# Patient Record
Sex: Male | Born: 1995 | Race: Black or African American | Hispanic: No | Marital: Single | Smoking: Current every day smoker
Health system: Southern US, Community
[De-identification: ages and names within clinical notes are randomized; demographics above are authoritative.]

---
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
## 2016-03-03 ENCOUNTER — Encounter (HOSPITAL_COMMUNITY): Payer: Self-pay | Admitting: Emergency Medicine

## 2016-03-03 ENCOUNTER — Emergency Department (HOSPITAL_COMMUNITY)
Admission: EM | Admit: 2016-03-03 | Discharge: 2016-03-03 | Disposition: A | Discharge status: Home / Self Care | Payer: Medicaid Other | Attending: Emergency Medicine | Admitting: Emergency Medicine

## 2016-03-03 DIAGNOSIS — R300 Dysuria: Secondary | ICD-10-CM

## 2016-03-03 DIAGNOSIS — R361 Hematospermia: Secondary | ICD-10-CM | POA: Insufficient documentation

## 2016-03-03 DIAGNOSIS — Z79899 Other long term (current) drug therapy: Secondary | ICD-10-CM | POA: Insufficient documentation

## 2016-03-03 MED ORDER — CEFTRIAXONE SODIUM 250 MG IJ SOLR
250.0000 mg | Freq: Once | INTRAMUSCULAR | Status: AC
  Administered 2016-03-03: 250 mg via INTRAMUSCULAR
  Filled 2016-03-03: qty 250

## 2016-03-03 MED ORDER — ONDANSETRON 4 MG PO TBDP
4.0000 mg | ORAL_TABLET | Freq: Once | ORAL | Status: AC
  Administered 2016-03-03: 4 mg via ORAL
  Filled 2016-03-03: qty 1

## 2016-03-03 MED ORDER — STERILE WATER FOR INJECTION IJ SOLN
INTRAMUSCULAR | Status: AC
  Administered 2016-03-03: 10 mL
  Filled 2016-03-03: qty 10

## 2016-03-03 MED ORDER — AZITHROMYCIN 250 MG PO TABS
1000.0000 mg | ORAL_TABLET | Freq: Once | ORAL | Status: AC
  Administered 2016-03-03: 1000 mg via ORAL
  Filled 2016-03-03: qty 4

## 2016-03-03 NOTE — Discharge Instructions (Signed)
Read the information below.  You may return to the Emergency Department at any time for worsening condition or any new symptoms that concern you.   If you develop high fevers, testicular or abdominal pain, uncontrolled vomiting, or are unable to tolerate fluids by mouth, return to the ER for a recheck.

## 2016-03-03 NOTE — ED Provider Notes (Signed)
[facility]-EMERGENCY DEPT Provider Note   CSN: [id number] Arrival date & time: 03/03/16  1626     History   Chief Complaint Chief Complaint  Patient presents with  . Dysuria    HPI Allen Griffith is a 21 y.o. male.  HPI   Patient presents with episodes of tingling in his penis following urination and ejaculation x 1 week following unprotected sex.  This was not a new partner.  Has known hydrocele on the right and occasionally has pain in the right testicle after multiple episodes of ejaculation.  Last night he noticed blood in his semen and became concerned.  [facility] and had UA and STD testing two days ago, is still waiting for results.  Denies fevers, chills, myalgias, N/V, abdominal pain, current penile or testicular pain, testicular swelling.       History reviewed. No pertinent past medical history.  There are no active problems to display for this patient.   History reviewed. No pertinent surgical history.     Home Medications    Prior to Admission medications   Medication Sig Start Date End Date Taking? Authorizing Provider  doxycycline (VIBRAMYCIN) 100 MG capsule Take 1 capsule (100 mg total) by mouth 2 (two) times daily. Patient not taking: Reported on 09/23/2015 05/13/15   Tatyana Kirichenko, PA-C  ibuprofen (ADVIL,MOTRIN) 200 MG tablet Take 400 mg by mouth every 6 (six) hours as needed for headache, mild pain or moderate pain.     Historical Provider, MD  morphine (MSIR) 15 MG tablet Take 1 tablet (15 mg total) by mouth every 4 (four) hours as needed for severe pain. 09/23/15   Melene Planan [place], DO    Family History History reviewed. No pertinent family history.  Social History Social History  Substance Use Topics  . Smoking status: Never Smoker  . Smokeless tobacco: Never Used  . Alcohol use No     Allergies   Patient has no known allergies.   Review of Systems Review of Systems  All other systems reviewed and are  negative.    Physical Exam Updated Vital Signs BP 127/99 (BP Location: Right Arm)   Pulse 79   Temp 98.2 F (36.8 C) (Oral)   Resp 18   Ht 6' (1.829 m)   Wt 99.8 kg   SpO2 98%   BMI 29.84 kg/m   Physical Exam  Constitutional: He appears well-developed and well-nourished. No distress.  HENT:  Head: Normocephalic and atraumatic.  Neck: Neck supple.  Cardiovascular: Normal rate and regular rhythm.   Pulmonary/Chest: Effort normal and breath sounds normal. No respiratory distress. He has no wheezes. He has no rales.  Abdominal: Soft. He exhibits no distension and no mass. There is no tenderness. There is no rebound and no guarding.  Neurological: He is alert. He exhibits normal muscle tone.  Skin: He is not diaphoretic.  Nursing note and vitals reviewed.    ED Treatments / Results  Labs (all labs ordered are listed, but only abnormal results are displayed) Labs Reviewed - No data to display  EKG  EKG Interpretation None       Radiology No results found.  Procedures Procedures (including critical care time)  Medications Ordered in ED Medications  cefTRIAXone (ROCEPHIN) injection 250 mg (250 mg Intramuscular Given 03/03/16 1942)  azithromycin (ZITHROMAX) tablet 1,000 mg (1,000 mg Oral Given 03/03/16 1940)  ondansetron (ZOFRAN-ODT) disintegrating tablet 4 mg (4 mg Oral Given 03/03/16 1940)  sterile water (preservative free) injection (10 mLs  Given 03/03/16 1942)     Initial Impression / Assessment and Plan / ED Course  I have reviewed the triage vital signs and the nursing notes.  Pertinent labs & imaging results that were available during my care of the patient were reviewed by me and considered in my medical decision making (see chart for details).  Clinical Course     Afebrile, nontoxic patient with known right hydrocele and new dysuria and penile pain after ejaculation.  [facility] these [facility] two days ago and tested for STDs/HIV,  urine also tested for infection.  He is awaiting results.  Came to ED today after seeing blood in his semen and becoming concerned.  Pt denies any penile or genital lesions or current pain.  Discussed options and engaged in joint medical decision making with the patient.  I do not think it would be helpful to rerun the same tests performed by the health department and patient agrees.  He is not having current testicular pain or swelling, do not believe ultrasound would be helpful at this time.  I did offer him empiric treatment for GC/Chlam and he agreed with this.  I have also encouraged him to follow up with urology.  He has been referred before for his hydrocele but never followed up.   D/C home with urology follow up.  Discussed result, findings, treatment, and follow up  with patient.  Pt given return precautions.  Pt verbalizes understanding and agrees with plan.       Final Clinical Impressions(s) / ED Diagnoses   Final diagnoses:  Hematospermia  Dysuria    New Prescriptions Discharge Medication List as of 03/03/2016  7:27 PM       Trixie Dredge, PA-C 03/03/16 2009    Nelva Nay, MD 03/04/16 1357

## 2016-03-03 NOTE — ED Triage Notes (Addendum)
Patient reports pain with urination x5 days. Denies abdominal pain, penile discharge, flank pain, N/V/D.

## 2016-07-19 ENCOUNTER — Encounter (HOSPITAL_COMMUNITY): Payer: Self-pay

## 2016-07-19 ENCOUNTER — Emergency Department (HOSPITAL_COMMUNITY)
Admission: EM | Admit: 2016-07-19 | Discharge: 2016-07-19 | Disposition: A | Discharge status: Home / Self Care | Payer: Self-pay | Attending: Emergency Medicine | Admitting: Emergency Medicine

## 2016-07-19 DIAGNOSIS — F17210 Nicotine dependence, cigarettes, uncomplicated: Secondary | ICD-10-CM | POA: Insufficient documentation

## 2016-07-19 DIAGNOSIS — R369 Urethral discharge, unspecified: Secondary | ICD-10-CM | POA: Insufficient documentation

## 2016-07-19 DIAGNOSIS — Z202 Contact with and (suspected) exposure to infections with a predominantly sexual mode of transmission: Secondary | ICD-10-CM | POA: Insufficient documentation

## 2016-07-19 DIAGNOSIS — Z79899 Other long term (current) drug therapy: Secondary | ICD-10-CM | POA: Insufficient documentation

## 2016-07-19 DIAGNOSIS — Z711 Person with feared health complaint in whom no diagnosis is made: Secondary | ICD-10-CM

## 2016-07-19 LAB — URINALYSIS, ROUTINE W REFLEX MICROSCOPIC
Bacteria, UA: NONE SEEN
Bilirubin Urine: NEGATIVE
Glucose, UA: NEGATIVE mg/dL
Hgb urine dipstick: NEGATIVE
KETONES UR: 5 mg/dL — AB
Nitrite: NEGATIVE
PH: 7 (ref 5.0–8.0)
PROTEIN: NEGATIVE mg/dL
SPECIFIC GRAVITY, URINE: 1.027 (ref 1.005–1.030)

## 2016-07-19 MED ORDER — CEFTRIAXONE SODIUM 250 MG IJ SOLR
250.0000 mg | Freq: Once | INTRAMUSCULAR | Status: AC
  Administered 2016-07-19: 250 mg via INTRAMUSCULAR
  Filled 2016-07-19: qty 250

## 2016-07-19 MED ORDER — METRONIDAZOLE 500 MG PO TABS
2000.0000 mg | ORAL_TABLET | Freq: Once | ORAL | Status: AC
  Administered 2016-07-19: 2000 mg via ORAL
  Filled 2016-07-19: qty 4

## 2016-07-19 MED ORDER — AZITHROMYCIN 250 MG PO TABS
1000.0000 mg | ORAL_TABLET | Freq: Every day | ORAL | Status: DC
  Administered 2016-07-19: 1000 mg via ORAL
  Filled 2016-07-19: qty 4

## 2016-07-19 MED ORDER — STERILE WATER FOR INJECTION IJ SOLN
INTRAMUSCULAR | Status: AC
  Administered 2016-07-19: 0.9 mL
  Filled 2016-07-19: qty 10

## 2016-07-19 NOTE — ED Triage Notes (Signed)
Patient c/o penile drainage, yellow in color x 3-4 days.

## 2016-07-19 NOTE — Discharge Instructions (Signed)
Your gonorrhea and chlamydia testing is not yet resulted.   We will notify you if your results are positive within 48-72 hours.  Please notify all partners of your symptoms so they can seek testing and treatment.

## 2016-07-19 NOTE — ED Provider Notes (Signed)
[facility]-EMERGENCY DEPT Provider Note   CSN: [id number] Arrival date & time: 07/19/16  1747  By signing my name below, I, Cynda AcresHailei Fulton, attest that this documentation has been prepared under the direction and in the presence of Sharen Hecklaudia, [place], [place]. Electronically Signed: Cynda AcresHailei Fulton, Scribe. 07/19/16. 8:37 PM.  History   Chief Complaint Chief Complaint  Patient presents with  . std check  . penile drainage    HPI Comments: Allen Griffith is a 21 y.o. male with no pertinent past medical history, who presents to the Emergency Department complaining of sudden-onset, intermittent yellow penile discharge that began 2 days ago. Patient reports having unprotected sex with a male recently. Patient states he usually wears a condom, but this time did not. Patient denies any testicular pain, dysuria, or hematuria. Patient states he was tested for STD's two months ago at [facility]. Patient reports associated abdominal pain after urinating and lower back pain. No modifying factors indicated. Patient denies any penile bleeding, penile pain, fever, chills, nausea, or vomiting.   The history is provided by the patient. No language interpreter was used.    History reviewed. No pertinent past medical history.  There are no active problems to display for this patient.   History reviewed. No pertinent surgical history.     Home Medications    Prior to Admission medications   Medication Sig Start Date End Date Taking? Authorizing Provider  doxycycline (VIBRAMYCIN) 100 MG capsule Take 1 capsule (100 mg total) by mouth 2 (two) times daily. Patient not taking: Reported on 09/23/2015 05/13/15   Jaynie CrumbleKirichenko, Tatyana, PA-C  ibuprofen (ADVIL,MOTRIN) 200 MG tablet Take 400 mg by mouth every 6 (six) hours as needed for headache, mild pain or moderate pain.     [provider]  morphine (MSIR) 15 MG tablet Take 1 tablet (15 mg total) by mouth every 4 (four) hours as needed for severe  pain. 09/23/15   Melene PlanFloyd, Dan, DO    Family History History reviewed. No pertinent family history.  Social History Social History  Substance Use Topics  . Smoking status: Current Every Day Smoker    Packs/day: 0.50    Types: Cigarettes  . Smokeless tobacco: Never Used  . Alcohol use No     Allergies   Patient has no known allergies.   Review of Systems Review of Systems  Constitutional: Negative for chills and fever.  HENT: Negative for congestion and sore throat.   Eyes: Negative for visual disturbance.  Respiratory: Negative for cough, chest tightness and shortness of breath.   Cardiovascular: Negative for chest pain.  Gastrointestinal: Negative for abdominal pain, constipation, diarrhea, nausea and vomiting.  Genitourinary: Positive for discharge. Negative for decreased urine volume, difficulty urinating, dysuria, hematuria, penile pain and testicular pain.  Musculoskeletal: Negative for arthralgias, back pain and joint swelling.  Skin: Negative for rash.  Neurological: Negative for dizziness, light-headedness and headaches.     Physical Exam Updated Vital Signs BP 124/65 (BP Location: Right Arm)   Pulse 63   Temp 98.6 F (37 C) (Oral)   Resp 18   Ht 6' (1.829 m)   Wt 88.9 kg (196 lb)   SpO2 99%   BMI 26.58 kg/m   Physical Exam  Constitutional: He is oriented to person, place, and time. He appears well-developed and well-nourished. No distress.  NAD.  HENT:  Head: Normocephalic and atraumatic.  Right Ear: External ear normal.  Left Ear: External ear normal.  Nose: Nose normal.  Moist mucous membranes.  No intraoral lesions Normal oropharynx and TMs  Eyes: Conjunctivae and EOM are normal. No scleral icterus.  Neck: Normal range of motion. Neck supple.  Cardiovascular: Normal rate, regular rhythm, normal heart sounds and intact distal pulses.   No murmur heard. Pulmonary/Chest: Effort normal and breath sounds normal. He has no wheezes.  Abdominal:  No  CVAT bilaterally.  No suprapubic tenderness. Negative Murphy's. Negative McBurney's. Negative Psoas sign.   Genitourinary: No penile tenderness.  Genitourinary Comments: Chaperone (scribe) was present for exam which was performed with no discomfort or complications.  External genitalia normal without erythema, edema, tenderness or lesions.  Circumcised male.  No groin lymphadenopathy.  No meatus discharge.  Glans and shaft smooth without tenderness, lesions, masses or deformity.  Scrotum without lesions or edema.  Non tender testicles. Epididymis and spermatic cord without tenderness or masses, bilaterally. Cremasteric reflex intact.  Musculoskeletal: Normal range of motion. He exhibits no deformity.  Neurological: He is alert and oriented to person, place, and time.  Skin: Skin is warm and dry. Capillary refill takes less than 2 seconds.  Psychiatric: He has a normal mood and affect. His behavior is normal. Judgment and thought content normal.  Nursing note and vitals reviewed.    ED Treatments / Results  DIAGNOSTIC STUDIES: Oxygen Saturation is 99% on RA, normal by my interpretation.    COORDINATION OF CARE: 8:36 PM Discussed treatment plan with pt at bedside and pt agreed to plan, which includes an STD check and antibiotics.   Labs (all labs ordered are listed, but only abnormal results are displayed) Labs Reviewed  URINALYSIS, ROUTINE W REFLEX MICROSCOPIC  GC/CHLAMYDIA PROBE AMP ([place]) NOT AT [facility]    EKG  EKG Interpretation None       Radiology No results found.  Procedures Procedures (including critical care time)  Medications Ordered in ED Medications  azithromycin (ZITHROMAX) tablet 1,000 mg (1,000 mg Oral Given 07/19/16 2105)  cefTRIAXone (ROCEPHIN) injection 250 mg (250 mg Intramuscular Given 07/19/16 2106)  metroNIDAZOLE (FLAGYL) tablet 2,000 mg (2,000 mg Oral Given 07/19/16 2105)  sterile water (preservative free) injection (0.9 mLs  Given 07/19/16  2107)     Initial Impression / Assessment and Plan / ED Course  I have reviewed the triage vital signs and the nursing notes.  Pertinent labs & imaging results that were available during my care of the patient were reviewed by me and considered in my medical decision making (see chart for details).    Pt is a 21 y.o. male with pmh of who presents with penile discharge after unprotected sexual interourse. Pt genital or oral lesions, fevers.  Pt otherwise asymptomatic.  On exam, pt in NAD. Nontoxic/nonseptic appearing. VSS and within normal limits. Afebrile. Abdomen nontender soft. GU exam unremarkable. GC/C pending. U/A negative. Given empiric tx for GC/C/trich in ED. Patient will be discharged at this time. ED return precautions given. STD education provided. All questions and concerns addressed prior to discharge.    Final Clinical Impressions(s) / ED Diagnoses   Final diagnoses:  Penile discharge  Concern about STD in male without diagnosis    New Prescriptions New Prescriptions   No medications on file   I personally performed the services described in this documentation, which was scribed in my presence. The recorded information has been reviewed and is accurate.     Liberty Handy, PA-C 07/19/16 2125    Derwood Kaplan, MD 07/20/16 1705

## 2016-07-19 NOTE — ED Notes (Signed)
Pt ambulated to restroom without assistance.

## 2016-07-20 LAB — GC/CHLAMYDIA PROBE AMP (~~LOC~~) NOT AT ARMC
Chlamydia: NEGATIVE
NEISSERIA GONORRHEA: NEGATIVE

## 2016-11-25 ENCOUNTER — Emergency Department (HOSPITAL_COMMUNITY)
Admission: EM | Admit: 2016-11-25 | Discharge: 2016-11-25 | Disposition: A | Discharge status: Home / Self Care | Payer: Self-pay | Attending: Emergency Medicine | Admitting: Emergency Medicine

## 2016-11-25 ENCOUNTER — Encounter (HOSPITAL_COMMUNITY): Payer: Self-pay | Admitting: Emergency Medicine

## 2016-11-25 DIAGNOSIS — N489 Disorder of penis, unspecified: Secondary | ICD-10-CM | POA: Insufficient documentation

## 2016-11-25 DIAGNOSIS — F17210 Nicotine dependence, cigarettes, uncomplicated: Secondary | ICD-10-CM | POA: Insufficient documentation

## 2016-11-25 DIAGNOSIS — R369 Urethral discharge, unspecified: Secondary | ICD-10-CM

## 2016-11-25 LAB — URINALYSIS, ROUTINE W REFLEX MICROSCOPIC
Bacteria, UA: NONE SEEN
Bilirubin Urine: NEGATIVE
Glucose, UA: NEGATIVE mg/dL
Hgb urine dipstick: NEGATIVE
KETONES UR: NEGATIVE mg/dL
Nitrite: NEGATIVE
PROTEIN: NEGATIVE mg/dL
SQUAMOUS EPITHELIAL / LPF: NONE SEEN
Specific Gravity, Urine: 1.026 (ref 1.005–1.030)
pH: 7 (ref 5.0–8.0)

## 2016-11-25 MED ORDER — STERILE WATER FOR INJECTION IJ SOLN
INTRAMUSCULAR | Status: AC
  Administered 2016-11-25: 0.9 mL
  Filled 2016-11-25: qty 10

## 2016-11-25 MED ORDER — AZITHROMYCIN 250 MG PO TABS
1000.0000 mg | ORAL_TABLET | Freq: Once | ORAL | Status: AC
  Administered 2016-11-25: 1000 mg via ORAL
  Filled 2016-11-25: qty 4

## 2016-11-25 MED ORDER — CEFTRIAXONE SODIUM 250 MG IJ SOLR
250.0000 mg | Freq: Once | INTRAMUSCULAR | Status: AC
  Administered 2016-11-25: 250 mg via INTRAMUSCULAR
  Filled 2016-11-25: qty 250

## 2016-11-25 NOTE — ED Provider Notes (Signed)
[facility]-EMERGENCY DEPT Provider Note   CSN: [id number] Arrival date & time: 11/25/16  1810     History   Chief Complaint Chief Complaint  Patient presents with  . Penile Discharge    HPI Allen Griffith is a 21 y.o. male.  HPI   Allen Griffith is a 21 y.o. male, patient with no pertinent past medical history, presenting to the ED with penile discharge and dysuria beginning today. Last sexual contact was about 4 days ago, unprotected, with a new male sexual partner. Denies testicular pain or swelling, abdominal pain, nausea/vomiting, fever/chills, rash, or any other complaints.     History reviewed. No pertinent past medical history.  There are no active problems to display for this patient.   History reviewed. No pertinent surgical history.     Home Medications    Prior to Admission medications   Medication Sig Start Date End Date Taking? Authorizing Provider  doxycycline (VIBRAMYCIN) 100 MG capsule Take 1 capsule (100 mg total) by mouth 2 (two) times daily. Patient not taking: Reported on 09/23/2015 05/13/15   Jaynie Crumble, PA-C  ibuprofen (ADVIL,MOTRIN) 200 MG tablet Take 400 mg by mouth every 6 (six) hours as needed for headache, mild pain or moderate pain.     [provider]  morphine (MSIR) 15 MG tablet Take 1 tablet (15 mg total) by mouth every 4 (four) hours as needed for severe pain. 09/23/15   Melene Plan, DO    Family History No family history on file.  Social History Social History  Substance Use Topics  . Smoking status: Current Every Day Smoker    Packs/day: 0.30    Types: Cigarettes  . Smokeless tobacco: Never Used  . Alcohol use Yes     Comment: Socially     Allergies   Patient has no known allergies.   Review of Systems Review of Systems  Constitutional: Negative for chills and fever.  Gastrointestinal: Negative for abdominal pain, nausea and vomiting.  Genitourinary: Positive for discharge and dysuria. Negative for  penile swelling, scrotal swelling and testicular pain.  Musculoskeletal: Negative for back pain.  Skin: Negative for rash.  All other systems reviewed and are negative.    Physical Exam Updated Vital Signs BP 109/67 (BP Location: Left Arm)   Pulse 68   Temp 98.5 F (36.9 C) (Oral)   Resp 18   SpO2 100%   Physical Exam  Constitutional: He appears well-developed and well-nourished. No distress.  HENT:  Head: Normocephalic and atraumatic.  Eyes: Conjunctivae are normal.  Neck: Neck supple.  Cardiovascular: Normal rate and regular rhythm.   Pulmonary/Chest: Effort normal. No respiratory distress.  Abdominal: Soft. There is no tenderness. There is no guarding.  Genitourinary:  Genitourinary Comments: Penis, scrotum, and testicles without swelling, lesions, or tenderness. No penile discharge. Cremasteric reflex intact. No inguinal lymphadenopathy. Overall normal male genitalia. RN, Florentina Addison, served as Biomedical engineer during the exam.  Musculoskeletal: He exhibits no edema.  Neurological: He is alert.  Skin: Skin is warm and dry. He is not diaphoretic.  Psychiatric: He has a normal mood and affect. His behavior is normal.  Nursing note and vitals reviewed.    ED Treatments / Results  Labs (all labs ordered are listed, but only abnormal results are displayed) Labs Reviewed  URINALYSIS, ROUTINE W REFLEX MICROSCOPIC - Abnormal; Notable for the following:       Result Value   APPearance HAZY (*)    Leukocytes, UA MODERATE (*)    All other components  within normal limits  RPR  HIV ANTIBODY (ROUTINE TESTING)  GC/CHLAMYDIA PROBE AMP ([place]) NOT AT [facility]    EKG  EKG Interpretation None       Radiology No results found.  Procedures Procedures (including critical care time)  Medications Ordered in ED Medications  cefTRIAXone (ROCEPHIN) injection 250 mg (not administered)  azithromycin (ZITHROMAX) tablet 1,000 mg (not administered)     Initial Impression / Assessment  and Plan / ED Course  I have reviewed the triage vital signs and the nursing notes.  Pertinent labs & imaging results that were available during my care of the patient were reviewed by me and considered in my medical decision making (see chart for details).     Patient presents with complaint of penile discharge and dysuria. STD testing initiated and patient empirically treated. Counseled on safe sex practices.  Final Clinical Impressions(s) / ED Diagnoses   Final diagnoses:  Penile discharge    New Prescriptions New Prescriptions   No medications on file     Concepcion Living 11/25/16 2110    Charlynne Pander, MD 11/25/16 2325

## 2016-11-25 NOTE — ED Triage Notes (Signed)
Pt comes with complaints of penile discharge that he first noticed today.  Last intercourse was last night and reportedly protected.  Denies burning sensation. No other complaints at this time.

## 2016-11-25 NOTE — Discharge Instructions (Addendum)
You have been tested for STDs. Some of these results are still pending. Any abnormalities will be called to you. You have been empirically treated for gonorrhea and Chlamydia. This does not mean you necessarily have these diseases, treatment is precautionary. Be sure to follow safe sex practices, including monogamy and/or condom use. For the treatment to be fully effective, avoid all sexual contact for at least 2 weeks after medication administration.

## 2016-11-26 LAB — GC/CHLAMYDIA PROBE AMP (~~LOC~~) NOT AT ARMC
Chlamydia: POSITIVE — AB
Neisseria Gonorrhea: POSITIVE — AB

## 2016-11-26 LAB — HIV ANTIBODY (ROUTINE TESTING W REFLEX): HIV Screen 4th Generation wRfx: NONREACTIVE

## 2016-11-27 LAB — RPR: RPR: NONREACTIVE

## 2017-07-07 ENCOUNTER — Emergency Department (HOSPITAL_COMMUNITY)
Admission: EM | Admit: 2017-07-07 | Discharge: 2017-07-08 | Disposition: A | Discharge status: Home / Self Care | Payer: PRIVATE HEALTH INSURANCE | Attending: Emergency Medicine | Admitting: Emergency Medicine

## 2017-07-07 ENCOUNTER — Encounter (HOSPITAL_COMMUNITY): Payer: Self-pay | Admitting: Nurse Practitioner

## 2017-07-07 DIAGNOSIS — R197 Diarrhea, unspecified: Secondary | ICD-10-CM | POA: Diagnosis not present

## 2017-07-07 DIAGNOSIS — M549 Dorsalgia, unspecified: Secondary | ICD-10-CM | POA: Insufficient documentation

## 2017-07-07 DIAGNOSIS — R109 Unspecified abdominal pain: Secondary | ICD-10-CM | POA: Diagnosis not present

## 2017-07-07 DIAGNOSIS — F17210 Nicotine dependence, cigarettes, uncomplicated: Secondary | ICD-10-CM | POA: Diagnosis not present

## 2017-07-07 DIAGNOSIS — Z87438 Personal history of other diseases of male genital organs: Secondary | ICD-10-CM

## 2017-07-07 DIAGNOSIS — R360 Urethral discharge without blood: Secondary | ICD-10-CM | POA: Insufficient documentation

## 2017-07-07 DIAGNOSIS — R112 Nausea with vomiting, unspecified: Secondary | ICD-10-CM | POA: Diagnosis present

## 2017-07-07 LAB — COMPREHENSIVE METABOLIC PANEL
ALBUMIN: 4.5 g/dL (ref 3.5–5.0)
ALT: 30 U/L (ref 17–63)
ANION GAP: 12 (ref 5–15)
AST: 33 U/L (ref 15–41)
Alkaline Phosphatase: 50 U/L (ref 38–126)
BILIRUBIN TOTAL: 0.4 mg/dL (ref 0.3–1.2)
BUN: 9 mg/dL (ref 6–20)
CO2: 22 mmol/L (ref 22–32)
Calcium: 9.7 mg/dL (ref 8.9–10.3)
Chloride: 104 mmol/L (ref 101–111)
Creatinine, Ser: 1.02 mg/dL (ref 0.61–1.24)
GFR calc Af Amer: >60 mL/min (ref >60)
GFR calc non Af Amer: >60 mL/min (ref >60)
GLUCOSE: 95 mg/dL (ref 65–99)
POTASSIUM: 3.4 mmol/L — AB (ref 3.5–5.1)
Sodium: 138 mmol/L (ref 135–145)
TOTAL PROTEIN: 8 g/dL (ref 6.5–8.1)

## 2017-07-07 LAB — URINALYSIS, ROUTINE W REFLEX MICROSCOPIC
BILIRUBIN URINE: NEGATIVE
GLUCOSE, UA: NEGATIVE mg/dL
HGB URINE DIPSTICK: NEGATIVE
KETONES UR: NEGATIVE mg/dL
Leukocytes, UA: NEGATIVE
Nitrite: NEGATIVE
PROTEIN: NEGATIVE mg/dL
Specific Gravity, Urine: 1.005 (ref 1.005–1.030)
pH: 7 (ref 5.0–8.0)

## 2017-07-07 LAB — CBC
HEMATOCRIT: 44 % (ref 39.0–52.0)
Hemoglobin: 15.4 g/dL (ref 13.0–17.0)
MCH: 29.7 pg (ref 26.0–34.0)
MCHC: 35 g/dL (ref 30.0–36.0)
MCV: 84.8 fL (ref 78.0–100.0)
Platelets: 172 10*3/uL (ref 150–400)
RBC: 5.19 MIL/uL (ref 4.22–5.81)
RDW: 13.6 % (ref 11.5–15.5)
WBC: 2.7 10*3/uL — ABNORMAL LOW (ref 4.0–10.5)

## 2017-07-07 MED ORDER — SODIUM CHLORIDE 0.9 % IV BOLUS
1000.0000 mL | Freq: Once | INTRAVENOUS | Status: AC
  Administered 2017-07-07: 1000 mL via INTRAVENOUS

## 2017-07-07 MED ORDER — POTASSIUM CHLORIDE CRYS ER 20 MEQ PO TBCR
40.0000 meq | EXTENDED_RELEASE_TABLET | Freq: Once | ORAL | Status: AC
  Administered 2017-07-08: 40 meq via ORAL
  Filled 2017-07-07: qty 2

## 2017-07-07 NOTE — ED Notes (Signed)
Bed: ZH08 Expected date:  Expected time:  Means of arrival:  Comments: 22 m n/v

## 2017-07-07 NOTE — ED Triage Notes (Signed)
Pt is c/o N/V/D for the last 4 days. Also states he has been having penile discharge since February of this year, seen a urologist but symptoms have only worsened.

## 2017-07-07 NOTE — Discharge Instructions (Addendum)
It was our pleasure to provide your ER care today - we hope that you feel better.  Rest. Drink plenty of fluids. From today's labs, your potassium level is slightly low - eat plenty of fruits and vegetables.   It is possible the recent nausea and diarrhea were caused by a self limited viral illness such as gastroenteritis. Antibiotic treatment also can cause nausea and loose stools. Follow up with primary care doctor if symptoms fail to improve/resolve in the next couple days.   Currently, we do not see a penile discharge, and the urine test is negative for urine infection. The results of the urethral swab will take a day to come back - we will notify you if positive. You can also have your urologist office follow up on those results in the next 1-2 days. Follow up closely with your urologist in the next few days.   Also, for further evaluation of symptoms for past few months, we recommend follow up with a primary care doctor/internal medicine doctor - follow up closely in the next 1-2 weeks.   Return to ER if worse, new symptoms, fevers, persistent vomiting, new or severe abdominal pain, other concern.

## 2017-07-07 NOTE — ED Provider Notes (Addendum)
[facility]-EMERGENCY DEPT Provider Note   CSN: [id number] Arrival date & time: 07/07/17  2135     History   Chief Complaint Chief Complaint  Patient presents with  . Nausea  . Emesis  . Penile Discharge    HPI Allen Griffith is a 22 y.o. male.  Patient with hx recurrent penile discharge in past several months, presents c/o intermittent nvd in past few days. Emesis clear, not bloody or bilious. No abd pain. No known ill contacts. States has been on recurrent doses abx since February. Denies dysuria or frequency. No fever or chills. No known std exposure in past few months, although did test positive for GC/chlamydia last fall. States has seen urologist 3 times in past few months, but denies specific diagnosis.   The history is provided by the patient.  Emesis   Associated symptoms include diarrhea. Pertinent negatives include no abdominal pain, no arthralgias, no cough, no fever, no headaches and no myalgias.  Penile Discharge  Pertinent negatives include no chest pain, no abdominal pain, no headaches and no shortness of breath.    History reviewed. No pertinent past medical history.  There are no active problems to display for this patient.   History reviewed. No pertinent surgical history.      Home Medications    Prior to Admission medications   Medication Sig Start Date End Date Taking? Authorizing Provider  doxycycline (VIBRAMYCIN) 100 MG capsule Take 1 capsule (100 mg total) by mouth 2 (two) times daily. Patient not taking: Reported on 09/23/2015 05/13/15   Jaynie Crumble, PA-C  ibuprofen (ADVIL,MOTRIN) 200 MG tablet Take 400 mg by mouth every 6 (six) hours as needed for headache, mild pain or moderate pain.     [provider]  morphine (MSIR) 15 MG tablet Take 1 tablet (15 mg total) by mouth every 4 (four) hours as needed for severe pain. 09/23/15   Melene Plan, DO    Family History History reviewed. No pertinent family  history.  Social History Social History   Tobacco Use  . Smoking status: Current Every Day Smoker    Packs/day: 0.30    Types: Cigarettes  . Smokeless tobacco: Never Used  Substance Use Topics  . Alcohol use: Yes    Comment: Socially  . Drug use: No     Allergies   Patient has no known allergies.   Review of Systems Review of Systems  Constitutional: Negative for fever.  HENT: Negative for sore throat.   Eyes: Negative for redness.  Respiratory: Negative for cough and shortness of breath.   Cardiovascular: Negative for chest pain.  Gastrointestinal: Positive for diarrhea and vomiting. Negative for abdominal pain.  Genitourinary: Positive for discharge. Negative for flank pain.  Musculoskeletal: Positive for back pain. Negative for arthralgias and myalgias.       Intermittent low back pain. Denies specific injury.   Skin: Negative for rash.  Neurological: Negative for headaches.  Hematological: Does not bruise/bleed easily.  Psychiatric/Behavioral: Negative for confusion.     Physical Exam Updated Vital Signs BP 133/69 (BP Location: Left Arm)   Pulse 81   Temp 99.3 F (37.4 C) (Oral)   Resp 18   SpO2 99%   Physical Exam  Constitutional: He appears well-developed and well-nourished. No distress.  HENT:  Mouth/Throat: Oropharynx is clear and moist.  Eyes: Conjunctivae are normal.  Neck: Neck supple. No tracheal deviation present.  Cardiovascular: Normal rate, regular rhythm, normal heart sounds and intact distal pulses. Exam reveals  no gallop and no friction rub.  No murmur heard. Pulmonary/Chest: Effort normal and breath sounds normal. No accessory muscle usage. No respiratory distress.  Abdominal: Soft. Bowel sounds are normal. He exhibits no distension and no mass. There is no tenderness. There is no rebound and no guarding. No hernia.  Genitourinary:  Genitourinary Comments: Normal external gu exam. No penile discharge. No cva tenderness.     Musculoskeletal: He exhibits no edema.  TLS spine non tender, aligned, no step off.   Neurological: He is alert.  Skin: Skin is warm and dry. No rash noted. He is not diaphoretic.  Psychiatric: He has a normal mood and affect.  Nursing note and vitals reviewed.    ED Treatments / Results  Labs (all labs ordered are listed, but only abnormal results are displayed) Results for orders placed or performed during the hospital encounter of 07/07/17  Urinalysis, Routine w reflex microscopic  Result Value Ref Range   Color, Urine STRAW (A) YELLOW   APPearance CLEAR CLEAR   Specific Gravity, Urine 1.005 1.005 - 1.030   pH 7.0 5.0 - 8.0   Glucose, UA NEGATIVE NEGATIVE mg/dL   Hgb urine dipstick NEGATIVE NEGATIVE   Bilirubin Urine NEGATIVE NEGATIVE   Ketones, ur NEGATIVE NEGATIVE mg/dL   Protein, ur NEGATIVE NEGATIVE mg/dL   Nitrite NEGATIVE NEGATIVE   Leukocytes, UA NEGATIVE NEGATIVE  CBC  Result Value Ref Range   WBC 2.7 (L) 4.0 - 10.5 K/uL   RBC 5.19 4.22 - 5.81 MIL/uL   Hemoglobin 15.4 13.0 - 17.0 g/dL   HCT 16.1 09.6 - 04.5 %   MCV 84.8 78.0 - 100.0 fL   MCH 29.7 26.0 - 34.0 pg   MCHC 35.0 30.0 - 36.0 g/dL   RDW 40.9 81.1 - 91.4 %   Platelets 172 150 - 400 K/uL  Comprehensive metabolic panel  Result Value Ref Range   Sodium 138 135 - 145 mmol/L   Potassium 3.4 (L) 3.5 - 5.1 mmol/L   Chloride 104 101 - 111 mmol/L   CO2 22 22 - 32 mmol/L   Glucose, Bld 95 65 - 99 mg/dL   BUN 9 6 - 20 mg/dL   Creatinine, Ser 7.82 0.61 - 1.24 mg/dL   Calcium 9.7 8.9 - 95.6 mg/dL   Total Protein 8.0 6.5 - 8.1 g/dL   Albumin 4.5 3.5 - 5.0 g/dL   AST 33 15 - 41 U/L   ALT 30 17 - 63 U/L   Alkaline Phosphatase 50 38 - 126 U/L   Total Bilirubin 0.4 0.3 - 1.2 mg/dL   GFR calc non Af Amer >60 >60 mL/min   GFR calc Af Amer >60 >60 mL/min   Anion gap 12 5 - 15    EKG None  Radiology No results found.  Procedures Procedures (including critical care time)  Medications Ordered in  ED Medications  sodium chloride 0.9 % bolus 1,000 mL (1,000 mLs Intravenous New Bag/Given 07/07/17 2233)     Initial Impression / Assessment and Plan / ED Course  I have reviewed the triage vital signs and the nursing notes.  Pertinent labs & imaging results that were available during my care of the patient were reviewed by me and considered in my medical decision making (see chart for details).  Reviewed nursing notes and prior charts for additional history.   ua and urethral swab sent. Pt requests blood work, labs sent.   Iv ns bolus. zofran iv.  Po fluids.   Pt/mom had  inquired about psa test. I went back to room (parent not in room) - discussed w pt psa not indicated for current ed eval, and that he could discuss with his urologist at f/u with them. Chemistries remain pending.   No episodes of vomiting or diarrhea in ED. Given po fluids. abd exam benign. Afebrile.    parents here, report recent back/flank pain, and recurrent urinary symptoms, request imaging study/CT - will obtain CT.   If CT neg acute, feel stable for d/c with outpt f/u medical/pcp and his urologist.   Labs reviewed - k sl low. kcl po.      Final Clinical Impressions(s) / ED Diagnoses   Final diagnoses:  None    ED Discharge Orders    None           Cathren Laine, MD 07/07/17 2359

## 2017-07-08 ENCOUNTER — Emergency Department (HOSPITAL_COMMUNITY): Payer: PRIVATE HEALTH INSURANCE

## 2017-07-09 LAB — GC/CHLAMYDIA PROBE AMP (~~LOC~~) NOT AT ARMC
Chlamydia: NEGATIVE
Neisseria Gonorrhea: NEGATIVE

## 2018-04-14 ENCOUNTER — Encounter (HOSPITAL_COMMUNITY): Payer: Self-pay | Admitting: Emergency Medicine

## 2018-04-14 ENCOUNTER — Other Ambulatory Visit: Payer: Self-pay

## 2018-04-14 ENCOUNTER — Emergency Department (HOSPITAL_COMMUNITY)
Admission: EM | Admit: 2018-04-14 | Discharge: 2018-04-14 | Disposition: A | Discharge status: Home / Self Care | Payer: PRIVATE HEALTH INSURANCE | Attending: Emergency Medicine | Admitting: Emergency Medicine

## 2018-04-14 DIAGNOSIS — F17210 Nicotine dependence, cigarettes, uncomplicated: Secondary | ICD-10-CM | POA: Insufficient documentation

## 2018-04-14 DIAGNOSIS — F419 Anxiety disorder, unspecified: Secondary | ICD-10-CM | POA: Insufficient documentation

## 2018-04-14 DIAGNOSIS — Z79899 Other long term (current) drug therapy: Secondary | ICD-10-CM | POA: Insufficient documentation

## 2018-04-14 DIAGNOSIS — F12188 Cannabis abuse with other cannabis-induced disorder: Secondary | ICD-10-CM | POA: Insufficient documentation

## 2018-04-14 LAB — URINALYSIS, ROUTINE W REFLEX MICROSCOPIC
BILIRUBIN URINE: NEGATIVE
Glucose, UA: NEGATIVE mg/dL
Hgb urine dipstick: NEGATIVE
Ketones, ur: NEGATIVE mg/dL
LEUKOCYTE UA: NEGATIVE
NITRITE: NEGATIVE
Protein, ur: NEGATIVE mg/dL
Specific Gravity, Urine: 1.027 (ref 1.005–1.030)
pH: 6 (ref 5.0–8.0)

## 2018-04-14 LAB — CBC
HCT: 48.8 % (ref 39.0–52.0)
Hemoglobin: 15.5 g/dL (ref 13.0–17.0)
MCH: 28.9 pg (ref 26.0–34.0)
MCHC: 31.8 g/dL (ref 30.0–36.0)
MCV: 91 fL (ref 80.0–100.0)
NRBC: 0 % (ref 0.0–0.2)
Platelets: 220 10*3/uL (ref 150–400)
RBC: 5.36 MIL/uL (ref 4.22–5.81)
RDW: 13.2 % (ref 11.5–15.5)
WBC: 7.4 10*3/uL (ref 4.0–10.5)

## 2018-04-14 LAB — COMPREHENSIVE METABOLIC PANEL
ALK PHOS: 55 U/L (ref 38–126)
ALT: 25 U/L (ref 0–44)
AST: 30 U/L (ref 15–41)
Albumin: 4.7 g/dL (ref 3.5–5.0)
Anion gap: 9 (ref 5–15)
BUN: 16 mg/dL (ref 6–20)
CHLORIDE: 102 mmol/L (ref 98–111)
CO2: 27 mmol/L (ref 22–32)
Calcium: 9.8 mg/dL (ref 8.9–10.3)
Creatinine, Ser: 0.88 mg/dL (ref 0.61–1.24)
GFR calc non Af Amer: >60 mL/min (ref >60)
Glucose, Bld: 117 mg/dL — ABNORMAL HIGH (ref 70–99)
Potassium: 3.4 mmol/L — ABNORMAL LOW (ref 3.5–5.1)
SODIUM: 138 mmol/L (ref 135–145)
TOTAL PROTEIN: 7.5 g/dL (ref 6.5–8.1)
Total Bilirubin: 0.6 mg/dL (ref 0.3–1.2)

## 2018-04-14 LAB — LIPASE, BLOOD: LIPASE: 26 U/L (ref 11–51)

## 2018-04-14 NOTE — ED Triage Notes (Signed)
Pt reports that he was at work at [employer] earlier and had to leave due to abd pains with nausea. Reports ben having it x month and the smell of grease or anything like that makes the nausea worse. Denies vomiting or diarrhea.  Pt uses marijuana.

## 2018-04-14 NOTE — ED Notes (Signed)
Pt & family member has been informed that MD needs UA sample

## 2018-04-14 NOTE — ED Provider Notes (Signed)
[facility]-EMERGENCY DEPT Provider Note   CSN: [id number] Arrival date & time: 04/14/18  1552    History   Chief Complaint Chief Complaint  Patient presents with  . Abdominal Pain  . Nausea    HPI [facility] is a 23 y.o. male.     23 year old male presents with recurrent abdominal pain times several months.  Patient smokes between 4-6 marijuana joints a day and symptoms began afterwards.  No fever or chills.  His bilious is been now bloody or bilious.  No diarrhea.  Abdominal pain is diffuse.  Occurred today and he became very anxious and symptoms resolved on their own.  He is currently asymptomatic.  No treatment used prior to arrival.     History reviewed. No pertinent past medical history.  There are no active problems to display for this patient.   History reviewed. No pertinent surgical history.      Home Medications    Prior to Admission medications   Medication Sig Start Date End Date Taking? Authorizing Provider  ibuprofen (ADVIL,MOTRIN) 200 MG tablet Take 400 mg by mouth every 6 (six) hours as needed for headache, mild pain or moderate pain.     [provider]  meloxicam (MOBIC) 15 MG tablet Take 15 mg by mouth daily as needed for pain.    [provider]  sulfamethoxazole-trimethoprim (BACTRIM DS,SEPTRA DS) 800-160 MG tablet Take 1 tablet by mouth 2 (two) times daily.    [provider]    Family History No family history on file.  Social History Social History   Tobacco Use  . Smoking status: Current Every Day Smoker    Packs/day: 0.30    Types: Cigarettes  . Smokeless tobacco: Never Used  Substance Use Topics  . Alcohol use: Yes    Comment: Socially  . Drug use: Yes    Types: Marijuana     Allergies   Patient has no known allergies.   Review of Systems Review of Systems  All other systems reviewed and are negative.    Physical Exam Updated Vital Signs BP 112/77   Pulse 86    Temp 97.7 F (36.5 C) (Oral)   Resp 17   Ht 1.829 m (6')   Wt 95.3 kg   SpO2 96%   BMI 28.48 kg/m   Physical Exam Vitals signs and nursing note reviewed.  Constitutional:      General: He is not in acute distress.    Appearance: Normal appearance. He is well-developed. He is not toxic-appearing.  HENT:     Head: Normocephalic and atraumatic.  Eyes:     General: Lids are normal.     Conjunctiva/sclera: Conjunctivae normal.     Pupils: Pupils are equal, round, and reactive to light.  Neck:     Musculoskeletal: Normal range of motion and neck supple.     Thyroid: No thyroid mass.     Trachea: No tracheal deviation.  Cardiovascular:     Rate and Rhythm: Normal rate and regular rhythm.     Heart sounds: Normal heart sounds. No murmur. No gallop.   Pulmonary:     Effort: Pulmonary effort is normal. No respiratory distress.     Breath sounds: Normal breath sounds. No stridor. No decreased breath sounds, wheezing, rhonchi or rales.  Abdominal:     General: Bowel sounds are normal. There is no distension.     Palpations: Abdomen is soft.     Tenderness: There is no abdominal tenderness.  There is no rebound.  Musculoskeletal: Normal range of motion.        General: No tenderness.  Skin:    General: Skin is warm and dry.     Findings: No abrasion or rash.  Neurological:     Mental Status: He is alert and oriented to person, place, and time.     GCS: GCS eye subscore is 4. GCS verbal subscore is 5. GCS motor subscore is 6.     Cranial Nerves: No cranial nerve deficit.     Sensory: No sensory deficit.  Psychiatric:        Speech: Speech normal.        Behavior: Behavior normal.      ED Treatments / Results  Labs (all labs ordered are listed, but only abnormal results are displayed) Labs Reviewed  CBC  LIPASE, BLOOD  COMPREHENSIVE METABOLIC PANEL  URINALYSIS, ROUTINE W REFLEX MICROSCOPIC    EKG None  Radiology No results found.  Procedures Procedures (including  critical care time)  Medications Ordered in ED Medications - No data to display   Initial Impression / Assessment and Plan / ED Course  I have reviewed the triage vital signs and the nursing notes.  Pertinent labs & imaging results that were available during my care of the patient were reviewed by me and considered in my medical decision making (see chart for details).        Labs here are reassuring.  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  Will give GI referral and patient consult to smoke less marijuana  Final Clinical Impressions(s) / ED Diagnoses   Final diagnoses:  None    ED Discharge Orders    None       Lorre Nick, MD 04/14/18 1826

## 2018-05-08 IMAGING — US US ART/VEN ABD/PELV/SCROTUM DOPPLER LTD
1 series · 14 of 25 positions shown · non-contrast
Comparison: None.

CLINICAL DATA: Right testicular pain for several days

EXAM:
SCROTAL ULTRASOUND
DOPPLER ULTRASOUND OF THE TESTICLES
TECHNIQUE: Complete ultrasound examination of the testicles, epididymis, and
other scrotal structures was performed. Color and spectral Doppler
ultrasound were also utilized to evaluate blood flow to the
testicles.

[Series 1: us art/ven abd/pelv/scrotum doppler ltd · 0.06mm/px · 14 of 108 slices shown]
[im 1/108]
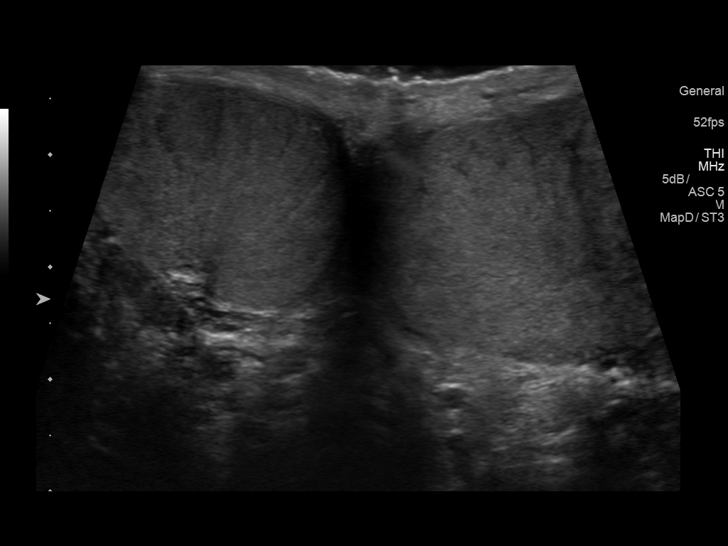
[im 9/108]
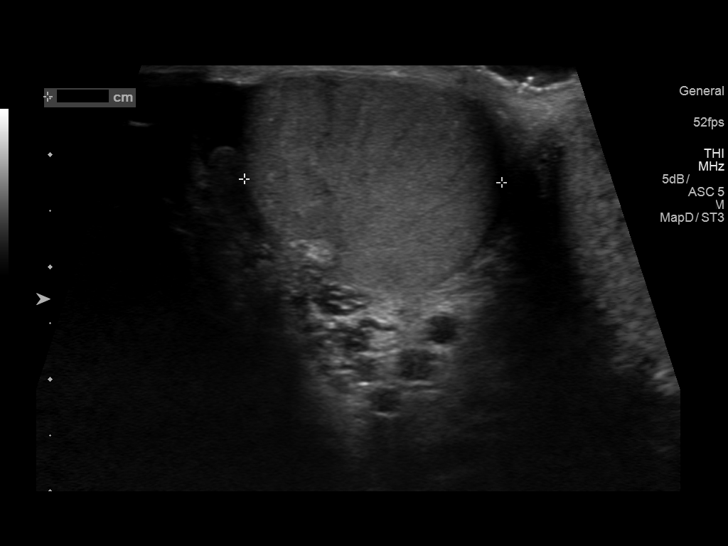
[im 18/108]
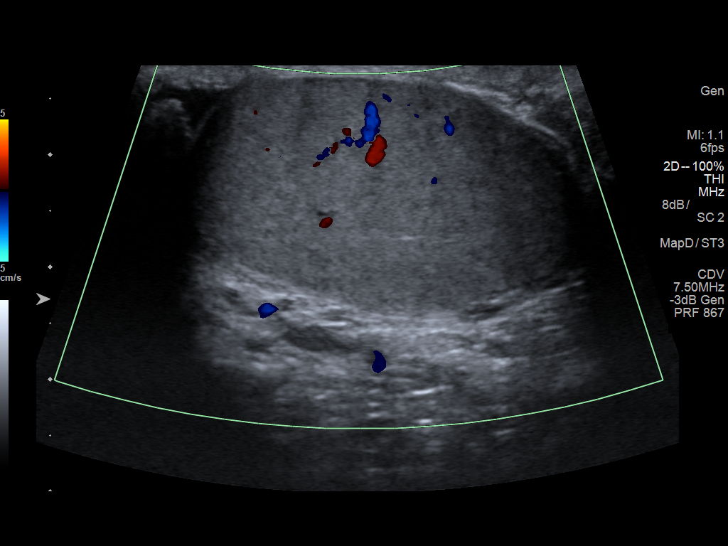
[im 27/108]
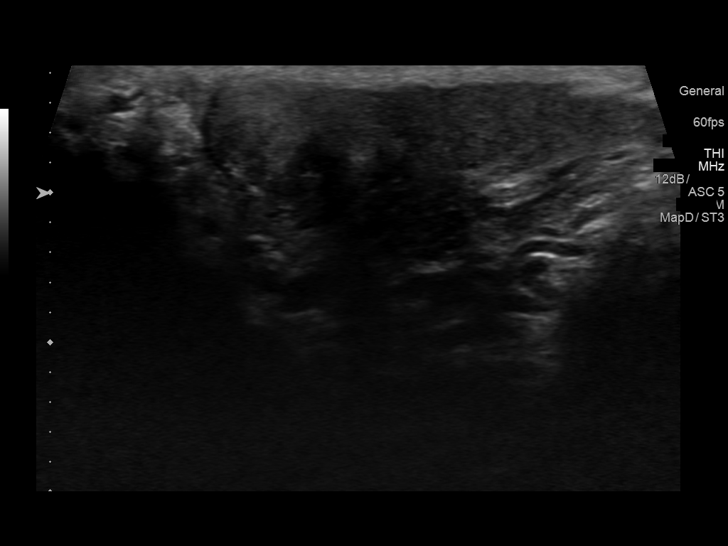
[im 36/108]
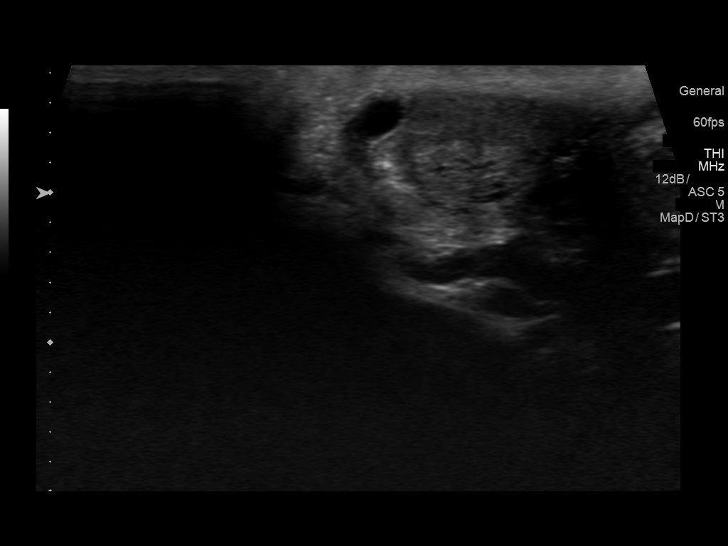
[im 41/108]
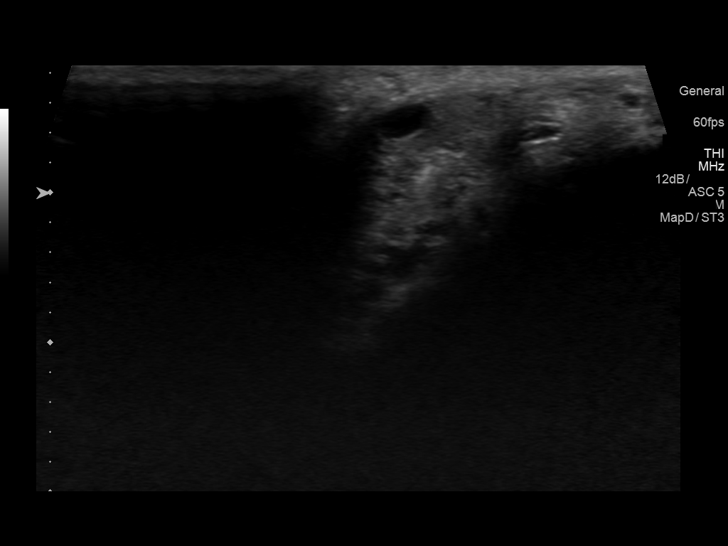
[im 50/108]
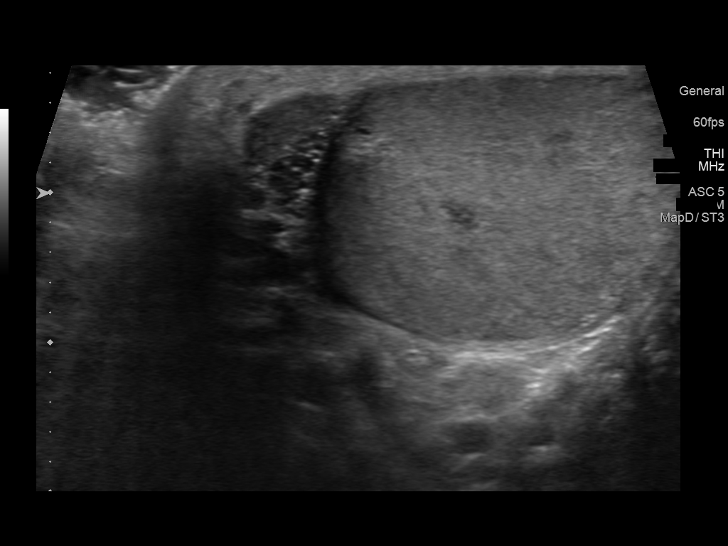
[im 58/108]
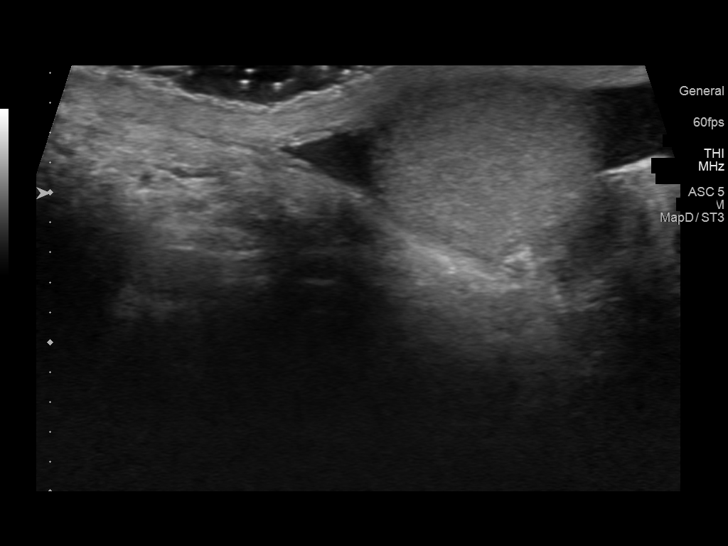
[im 67/108]
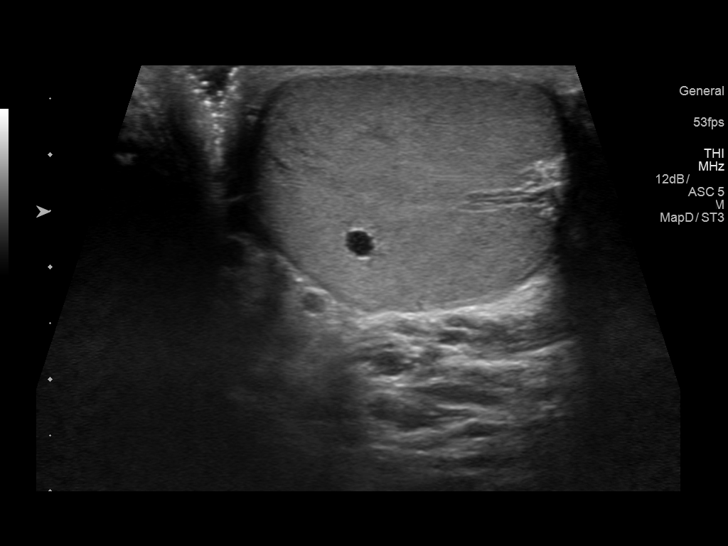
[im 72/108]
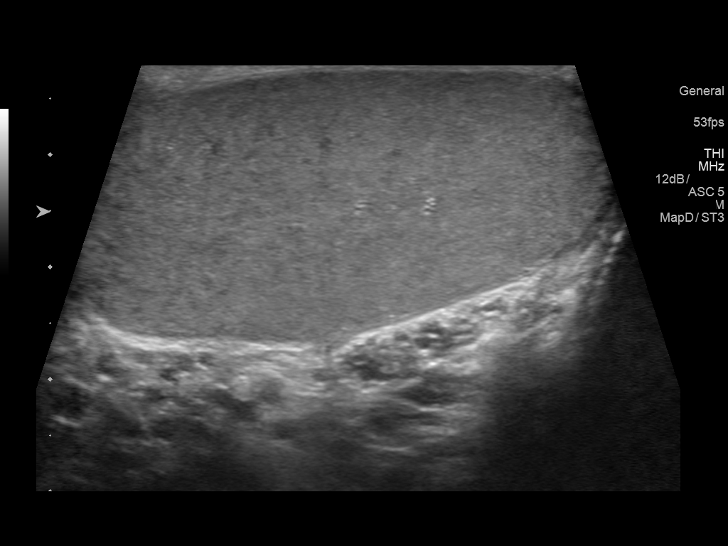
[im 81/108]
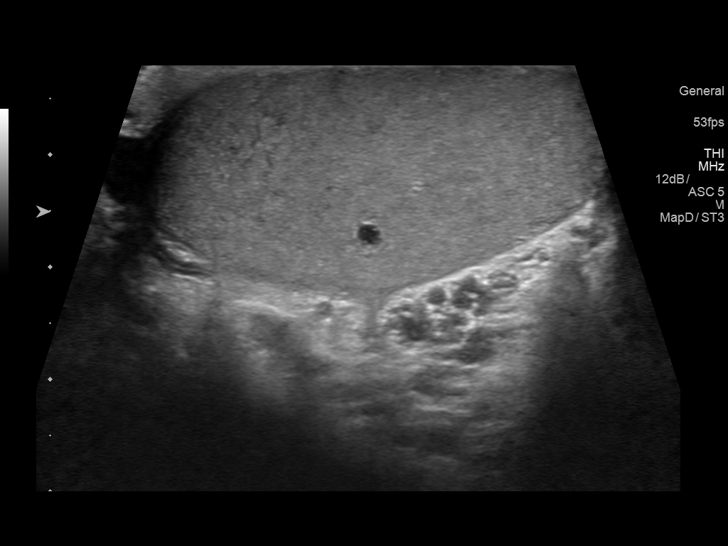
[im 90/108]
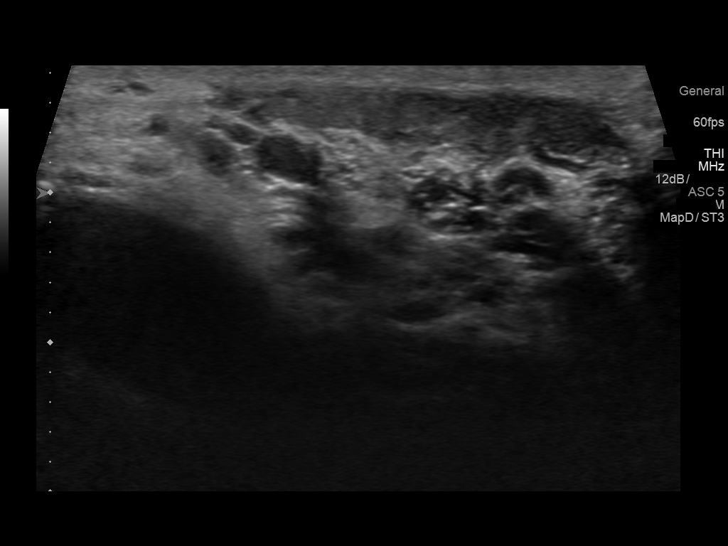
[im 99/108]
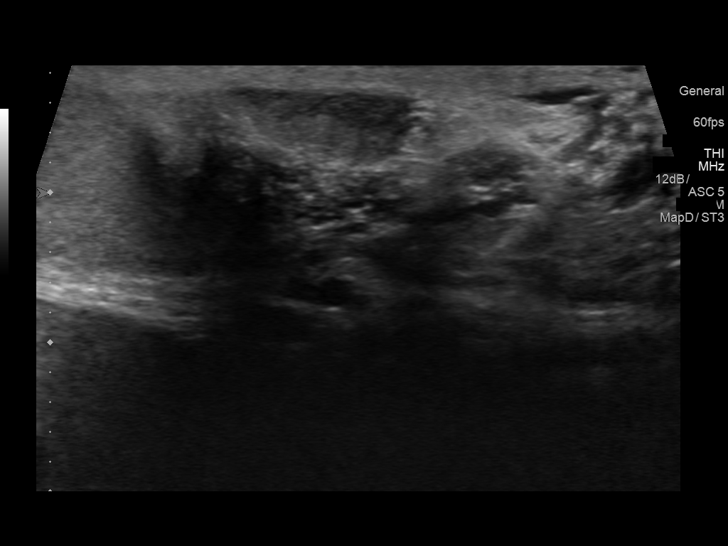
[im 108/108]
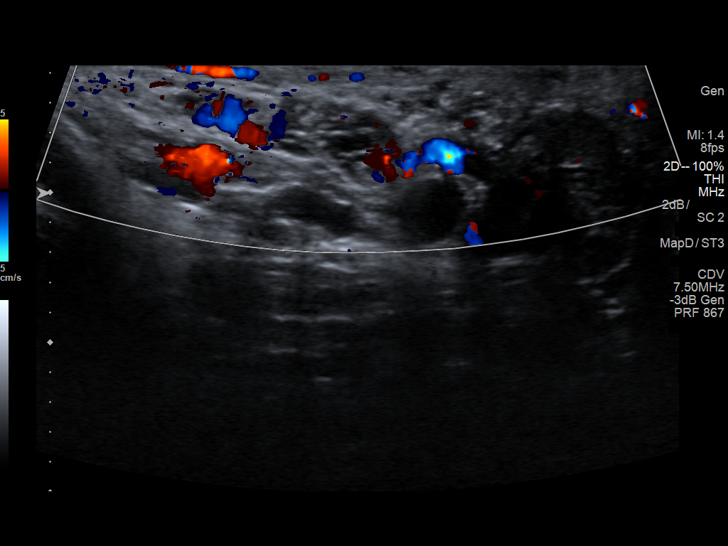

[14 of 25 positions shown; findings below may reference images not displayed]

FINDINGS: Right testicle

Measurements: 3.8 x 1.9 x 2.3 cm.. No mass or microlithiasis
visualized.

Left testicle

Measurements: 4.9 x 2.4 x 2.8 cm.. Small 3 mm testicular cyst is
noted.

Right epididymis:  3 mm epididymal cyst is noted.

Left epididymis:  Normal in size and appearance.

Hydrocele:  Right hydrocele is present

Varicocele:  None visualized.

Pulsed Doppler interrogation of both testes demonstrates normal low
resistance arterial and venous waveforms bilaterally.
IMPRESSION: Right hydrocele.

No acute abnormality noted.

## 2019-04-13 ENCOUNTER — Emergency Department (HOSPITAL_COMMUNITY)
Admission: EM | Admit: 2019-04-13 | Discharge: 2019-04-13 | Disposition: A | Discharge status: Home / Self Care | Payer: Self-pay | Attending: Emergency Medicine | Admitting: Emergency Medicine

## 2019-04-13 ENCOUNTER — Encounter (HOSPITAL_COMMUNITY): Payer: Self-pay

## 2019-04-13 ENCOUNTER — Other Ambulatory Visit: Payer: Self-pay

## 2019-04-13 DIAGNOSIS — F17210 Nicotine dependence, cigarettes, uncomplicated: Secondary | ICD-10-CM | POA: Insufficient documentation

## 2019-04-13 DIAGNOSIS — N342 Other urethritis: Secondary | ICD-10-CM | POA: Insufficient documentation

## 2019-04-13 MED ORDER — CEFTRIAXONE SODIUM 250 MG IJ SOLR
250.0000 mg | Freq: Once | INTRAMUSCULAR | Status: AC
  Administered 2019-04-13: 05:00:00 250 mg via INTRAMUSCULAR
  Filled 2019-04-13: qty 250

## 2019-04-13 MED ORDER — LIDOCAINE HCL 1 % IJ SOLN
INTRAMUSCULAR | Status: AC
  Administered 2019-04-13: 06:00:00 20 mL
  Filled 2019-04-13: qty 20

## 2019-04-13 MED ORDER — METRONIDAZOLE 500 MG PO TABS: 500.0000 mg | ORAL_TABLET | Freq: Two times a day (BID) | ORAL | 0 refills | Status: DC

## 2019-04-13 MED ORDER — AZITHROMYCIN 250 MG PO TABS
1000.0000 mg | ORAL_TABLET | Freq: Once | ORAL | Status: AC
  Administered 2019-04-13: 05:00:00 1000 mg via ORAL
  Filled 2019-04-13: qty 4

## 2019-04-13 NOTE — ED Provider Notes (Signed)
[facility] DEPT Provider Note   CSN: [id number] Arrival date & time: 04/13/19  0419     History Chief Complaint  Patient presents with  . Dysuria    [facility] is a 24 y.o. male.  Patient to ED with complaint of burning sensation when he urinates x 2 weeks. He denies penile discharge, scrotal swelling, testicular pain, genital blisters or ulcerations. No fever, nausea, vomiting, abdominal pain or flank pain. He reports unprotected sex one month ago.   The history is provided by the patient. No language interpreter was used.  Dysuria Presenting symptoms: dysuria   Presenting symptoms: no penile discharge   Associated symptoms: no abdominal pain, no fever, no flank pain and no scrotal swelling        History reviewed. No pertinent past medical history.  There are no problems to display for this patient.   History reviewed. No pertinent surgical history.     No family history on file.  Social History   Tobacco Use  . Smoking status: Current Every Day Smoker    Packs/day: 0.30    Types: Cigarettes  . Smokeless tobacco: Never Used  Substance Use Topics  . Alcohol use: Yes    Comment: Socially  . Drug use: Yes    Types: Marijuana    Home Medications Prior to Admission medications   Not on File    Allergies    Sulfa antibiotics  Review of Systems   Review of Systems  Constitutional: Negative for chills and fever.  Gastrointestinal: Negative for abdominal pain.  Genitourinary: Positive for dysuria. Negative for difficulty urinating, discharge, flank pain, genital sores, scrotal swelling and testicular pain.    Physical Exam Updated Vital Signs BP 136/87 (BP Location: Right Arm)   Pulse 87   Temp 98.7 F (37.1 C) (Oral)   Resp 16   SpO2 100%   Physical Exam Vitals and nursing note reviewed.  Constitutional:      General: He is not in acute distress.    Appearance: Normal appearance.  Pulmonary:     Effort:  Pulmonary effort is normal.  Abdominal:     General: Abdomen is flat. There is no distension.     Palpations: Abdomen is soft.     Tenderness: There is no abdominal tenderness.  Genitourinary:    Comments: No testicular tenderness or scrotal swelling. No genital lesions. Circumcised penis without discharge present. There are swollen inguinal lymph nodes.  Skin:    General: Skin is warm and dry.  Neurological:     Mental Status: He is alert and oriented to person, place, and time.     ED Results / Procedures / Treatments   Labs (all labs ordered are listed, but only abnormal results are displayed) Labs Reviewed - No data to display  EKG None  Radiology No results found.  Procedures Procedures (including critical care time)  Medications Ordered in ED Medications - No data to display  ED Course  I have reviewed the triage vital signs and the nursing notes.  Pertinent labs & imaging results that were available during my care of the patient were reviewed by me and considered in my medical decision making (see chart for details).    MDM Rules/Calculators/A&P                      Patient to ED with dysuria x 2 weeks, occurring 2 weeks after unprotected intercourse. No fever  Chart reviewed. The patient has  been seen multiple times in the ED for STD and urethritis. He has no fever, no progressive ss/sxs illness, is circumcised. Doubt UTI. Suspect recurrent STD given exposure and duration of symptoms. Culture pending.   Will treat with Rocephin and Zithromax in the ED, Rx Flagyl for 1 weeks. Discussed resource of [facility] for STD concerns, safe sex practices.  Final Clinical Impression(s) / ED Diagnoses Final diagnoses:  None   1. Urethritis  Rx / DC Orders ED Discharge Orders    None       Elpidio Anis, PA-C 04/13/19 0522    Ward, Layla Maw, DO 04/13/19 [phone]

## 2019-04-13 NOTE — ED Triage Notes (Signed)
Patient arrived stating a month ago he had unprotected sex, about two weeks ago started noticing occasional burning during urination. Denies any drainage. States he would like to be tested for STDs. Reports also having occasional loose stool for the last two weeks as well.

## 2019-06-25 ENCOUNTER — Encounter (HOSPITAL_COMMUNITY): Payer: Self-pay | Admitting: Emergency Medicine

## 2019-06-25 ENCOUNTER — Emergency Department (HOSPITAL_COMMUNITY)
Admission: EM | Admit: 2019-06-25 | Discharge: 2019-06-26 | Disposition: A | Discharge status: Home / Self Care | Payer: No Typology Code available for payment source | Attending: Emergency Medicine | Admitting: Emergency Medicine

## 2019-06-25 ENCOUNTER — Other Ambulatory Visit: Payer: Self-pay

## 2019-06-25 DIAGNOSIS — Y92410 Unspecified street and highway as the place of occurrence of the external cause: Secondary | ICD-10-CM | POA: Insufficient documentation

## 2019-06-25 DIAGNOSIS — Y93I9 Activity, other involving external motion: Secondary | ICD-10-CM | POA: Diagnosis not present

## 2019-06-25 DIAGNOSIS — F17210 Nicotine dependence, cigarettes, uncomplicated: Secondary | ICD-10-CM | POA: Insufficient documentation

## 2019-06-25 DIAGNOSIS — M542 Cervicalgia: Secondary | ICD-10-CM | POA: Insufficient documentation

## 2019-06-25 DIAGNOSIS — R109 Unspecified abdominal pain: Secondary | ICD-10-CM | POA: Diagnosis not present

## 2019-06-25 DIAGNOSIS — R519 Headache, unspecified: Secondary | ICD-10-CM | POA: Insufficient documentation

## 2019-06-25 DIAGNOSIS — Y999 Unspecified external cause status: Secondary | ICD-10-CM | POA: Diagnosis not present
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
## 2019-06-25 NOTE — ED Notes (Signed)
Pt step outside 

## 2019-06-25 NOTE — ED Notes (Signed)
Pt is standing outside 

## 2019-06-26 NOTE — Discharge Instructions (Signed)
You may use over-the-counter Motrin (Ibuprofen), Acetaminophen (Tylenol), topical muscle creams such as SalonPas, Icy Hot, Bengay, etc. Please stretch, apply heat, and have massage therapy for additional assistance. ° °

## 2019-06-26 NOTE — ED Notes (Signed)
Pt is sitting outside 
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
## 2019-06-28 ENCOUNTER — Other Ambulatory Visit: Payer: Self-pay

## 2019-06-28 ENCOUNTER — Emergency Department (HOSPITAL_COMMUNITY)
Admission: EM | Admit: 2019-06-28 | Discharge: 2019-06-28 | Disposition: A | Discharge status: Home / Self Care | Payer: No Typology Code available for payment source | Attending: Emergency Medicine | Admitting: Emergency Medicine

## 2019-06-28 DIAGNOSIS — Z79899 Other long term (current) drug therapy: Secondary | ICD-10-CM | POA: Insufficient documentation

## 2019-06-28 DIAGNOSIS — F17210 Nicotine dependence, cigarettes, uncomplicated: Secondary | ICD-10-CM | POA: Insufficient documentation

## 2019-06-28 DIAGNOSIS — S161XXD Strain of muscle, fascia and tendon at neck level, subsequent encounter: Secondary | ICD-10-CM

## 2019-06-28 DIAGNOSIS — S39012D Strain of muscle, fascia and tendon of lower back, subsequent encounter: Secondary | ICD-10-CM | POA: Diagnosis present

## 2019-06-28 DIAGNOSIS — M25512 Pain in left shoulder: Secondary | ICD-10-CM | POA: Diagnosis not present

## 2019-06-28 MED ORDER — CYCLOBENZAPRINE HCL 10 MG PO TABS: 10.0000 mg | ORAL_TABLET | Freq: Every day | ORAL | 0 refills | Status: DC
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
## 2019-06-28 NOTE — Discharge Instructions (Addendum)
Per our discussion, I would recommend that you continue taking ibuprofen and Tylenol for your pain.  You can take this every 6-8 hours.  I would recommend 400 mg of ibuprofen which is 2 regular ibuprofen.  I would also recommend 325 mg of Tylenol which is 1 regular Tylenol.  I am going to prescribe you a short course of Flexeril.  You can take 1 of these at night with dinner.  These have a strong sedating effect.  Please do not mix these with alcohol or drive a vehicle after taking them.  You can also purchase Biofreeze or Voltaren gel at your local pharmacy.  You can apply these to the affected regions as needed.  Feel free to return to the emergency department if you have any new or worsening symptoms.  It was a pleasure to meet you.

## 2019-08-20 ENCOUNTER — Other Ambulatory Visit: Payer: Self-pay

## 2019-08-20 ENCOUNTER — Emergency Department (HOSPITAL_COMMUNITY)
Admission: EM | Admit: 2019-08-20 | Discharge: 2019-08-20 | Disposition: A | Discharge status: Home / Self Care | Payer: Self-pay | Attending: Emergency Medicine | Admitting: Emergency Medicine

## 2019-08-20 ENCOUNTER — Encounter (HOSPITAL_COMMUNITY): Payer: Self-pay

## 2019-08-20 DIAGNOSIS — R369 Urethral discharge, unspecified: Secondary | ICD-10-CM | POA: Insufficient documentation

## 2019-08-20 DIAGNOSIS — N4829 Other inflammatory disorders of penis: Secondary | ICD-10-CM | POA: Insufficient documentation

## 2019-08-20 DIAGNOSIS — F17210 Nicotine dependence, cigarettes, uncomplicated: Secondary | ICD-10-CM | POA: Insufficient documentation

## 2019-08-20 LAB — RPR: RPR Ser Ql: NONREACTIVE

## 2019-08-20 LAB — RAPID HIV SCREEN (HIV 1/2 AB+AG)
HIV 1/2 Antibodies: NONREACTIVE
HIV-1 P24 Antigen - HIV24: NONREACTIVE

## 2019-08-20 MED ORDER — DOXYCYCLINE HYCLATE 100 MG PO TABS
100.0000 mg | ORAL_TABLET | Freq: Once | ORAL | Status: AC
  Administered 2019-08-20: 100 mg via ORAL
  Filled 2019-08-20: qty 1

## 2019-08-20 MED ORDER — CEFTRIAXONE SODIUM 1 G IJ SOLR
500.0000 mg | Freq: Once | INTRAMUSCULAR | Status: AC
  Administered 2019-08-20: 500 mg via INTRAMUSCULAR
  Filled 2019-08-20: qty 10

## 2019-08-20 MED ORDER — DOXYCYCLINE HYCLATE 100 MG PO CAPS: 100.0000 mg | ORAL_CAPSULE | Freq: Two times a day (BID) | ORAL | 0 refills | Status: AC

## 2019-08-20 NOTE — ED Triage Notes (Signed)
Patient c/o dysuria x 4 days. Patient also c/o a white penile discharge  X 4 days.

## 2019-08-20 NOTE — ED Provider Notes (Signed)
Emergency Department Provider Note   I have reviewed the triage vital signs and the nursing notes.   HISTORY  Chief Complaint Dysuria and Penile Discharge   HPI Allen Griffith is a 24 y.o. male presents to the emergency department for evaluation of mild dysuria with urethral discharge noticed this morning.  Patient has been having unprotected sex but does not have a specific STD exposure concern.  He has had these symptoms in the past including in February of this past year where he was treated for STDs.  He denies any fevers, backslash flank pain.  He notes that he has had a rash near the penis several weeks ago but this has resolved.  No radiation of symptoms or other modifying factors.  History reviewed. No pertinent past medical history.  There are no problems to display for this patient.   History reviewed. No pertinent surgical history.  Allergies Sulfa antibiotics  History reviewed. No pertinent family history.  Social History Social History   Tobacco Use  . Smoking status: Current Every Day Smoker    Packs/day: 0.30    Types: Cigarettes  . Smokeless tobacco: Never Used  Vaping Use  . Vaping Use: Never used  Substance Use Topics  . Alcohol use: Yes    Comment: Socially  . Drug use: Yes    Types: Marijuana    Review of Systems  Constitutional: No fever/chills Gastrointestinal: No abdominal pain.  Genitourinary: Positive for dysuria and urethral discharge.  Musculoskeletal: Negative for back pain. Skin: Negative for rash.  Neurological: Negative for headaches.  ____________________________________________   PHYSICAL EXAM:  VITAL SIGNS: ED Triage Vitals  Enc Vitals Group     BP 08/20/19 0834 130/70     Pulse Rate 08/20/19 0834 67     Resp 08/20/19 0834 16     Temp 08/20/19 0834 98.1 F (36.7 C)     Temp Source 08/20/19 0834 Oral     SpO2 08/20/19 0834 99 %     Weight 08/20/19 0837 196 lb (88.9 kg)     Height 08/20/19 0837 6' (1.829 m)    Constitutional: Alert and oriented. Well appearing and in no acute distress. Eyes: Conjunctivae are normal.  Head: Atraumatic. Nose: No congestion/rhinnorhea. Mouth/Throat: Mucous membranes are moist.  Neck: No stridor.  Cardiovascular: Normal rate, regular rhythm.  Respiratory: Normal respiratory effort. Gastrointestinal:  No distention.  Genitourinary: Exam performed with patient's verbal consent.  Patient has a small amount of white discharge.  No surrounding erythema over the glans.  No rash appreciated in the groin area.  Musculoskeletal: No gross deformities of extremities. Neurologic:  Normal speech and language.  Skin:  Skin is warm, dry and intact. No rash noted.  ____________________________________________   LABS (all labs ordered are listed, but only abnormal results are displayed)  Labs Reviewed  RAPID HIV SCREEN (HIV 1/2 AB+AG)  RPR  GC/CHLAMYDIA PROBE AMP ([place]) NOT AT [facility]   ____________________________________________   PROCEDURES  Procedure(s) performed:   Procedures  None  ____________________________________________   INITIAL IMPRESSION / ASSESSMENT AND PLAN / ED COURSE  Pertinent labs & imaging results that were available during my care of the patient were reviewed by me and considered in my medical decision making (see chart for details).   Patient presents emergency department for evaluation of dysuria and urethral discharge.  Plan to cover for sexually transmitted infections which the patient has had in the past.  He has had unprotected sex recently.  Discussed completing a course  of antibiotics and using barrier contraception and/or abstinence until fully treated.  He was also advised to alert any partner(s) regarding his symptoms and advised that they be treated and tested as well.  Patient to follow be test results in the MyChart app.  Information provided at discharge regarding this application. Provided contact info for PCP to establish  care.    ____________________________________________  FINAL CLINICAL IMPRESSION(S) / ED DIAGNOSES  Final diagnoses:  Penile discharge     MEDICATIONS GIVEN DURING THIS VISIT:  Medications  cefTRIAXone (ROCEPHIN) injection 500 mg (has no administration in time range)  doxycycline (VIBRA-TABS) tablet 100 mg (has no administration in time range)     NEW OUTPATIENT MEDICATIONS STARTED DURING THIS VISIT:  New Prescriptions   DOXYCYCLINE (VIBRAMYCIN) 100 MG CAPSULE    Take 1 capsule (100 mg total) by mouth 2 (two) times daily for 7 days.    Note:  This document was prepared using Dragon voice recognition software and may include unintentional dictation errors.  Alona Bene, MD, [facility]    Kaity Pitstick, Arlyss Repress, MD 08/20/19 [phone]

## 2019-08-20 NOTE — Discharge Instructions (Signed)
You have been seen today in the Emergency Department (ED) for discharge.  This may be from a sexually transmitted disease (STD).  We are sending testing out and the results will be available in the MyChart app in the next 48 hours. You have been treated with antibiotics and it is important that you continue this medication until it is gone.  Please have your partner tested for STD's and do not resume sexual activity until you and your partner have confirmed negative test results or have both been treated.  Please follow up with your doctor as soon as possible regarding today's ED visit and your symptoms.   Return to the ED if your pain worsens, you develop a fever, or for any other symptoms that concern you.

## 2019-08-21 LAB — GC/CHLAMYDIA PROBE AMP (~~LOC~~) NOT AT ARMC
Chlamydia: NEGATIVE
Comment: NEGATIVE
Comment: NORMAL
Neisseria Gonorrhea: NEGATIVE

## 2020-07-17 ENCOUNTER — Encounter (HOSPITAL_COMMUNITY): Payer: Self-pay

## 2020-07-17 ENCOUNTER — Other Ambulatory Visit: Payer: Self-pay

## 2020-07-17 ENCOUNTER — Emergency Department (HOSPITAL_COMMUNITY)
Admission: EM | Admit: 2020-07-17 | Discharge: 2020-07-17 | Disposition: A | Discharge status: Home / Self Care | Payer: Self-pay | Attending: Emergency Medicine | Admitting: Emergency Medicine

## 2020-07-17 DIAGNOSIS — Z113 Encounter for screening for infections with a predominantly sexual mode of transmission: Secondary | ICD-10-CM | POA: Insufficient documentation

## 2020-07-17 DIAGNOSIS — F17210 Nicotine dependence, cigarettes, uncomplicated: Secondary | ICD-10-CM | POA: Insufficient documentation

## 2020-07-17 DIAGNOSIS — Z711 Person with feared health complaint in whom no diagnosis is made: Secondary | ICD-10-CM

## 2020-07-17 NOTE — ED Triage Notes (Signed)
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 Patient states they did not have intercourse, but did have oral sex.

## 2020-07-17 NOTE — Discharge Instructions (Addendum)
Please refrain from sex for the next 1 to 2 weeks and continue to monitor your symptoms closely.  If you develop any new rashes, bumps, lesions, swelling, pain, discharge, or burning when you pee, please come back to the emergency department or the health department and get tested for sexually transmitted diseases.  In the future, please make sure that you are wearing a condom whenever you are having sexual intercourse.

## 2020-07-17 NOTE — ED Provider Notes (Signed)
[facility]-EMERGENCY DEPT Provider Note   CSN: [id number] Arrival date & time: 07/17/20  1006     History Chief Complaint  Patient presents with  . STD check    Allen Griffith is a 25 y.o. male.  HPI   Patient is a 25 year old male with no pertinent medical history presents to the emergency department for evaluation regarding a possible STD.  Patient states that he had receptive oral intercourse with a new male partner about 3 days ago.  She called him yesterday and told him that she believes she has HSV 1.  Patient states that they kissed briefly and she performed receptive oral intercourse but otherwise denies any vaginal or anal intercourse.  He is currently asymptomatic.  Denies any lesions, swelling, penile pain, scrotal pain, penile discharge, lymphadenopathy, rashes.     History reviewed. No pertinent past medical history.  There are no problems to display for this patient.   History reviewed. No pertinent surgical history.     Family History  Family history unknown: Yes    Social History   Tobacco Use  . Smoking status: Current Every Day Smoker    Packs/day: 0.30    Types: Cigarettes  . Smokeless tobacco: Never Used  Vaping Use  . Vaping Use: Never used  Substance Use Topics  . Alcohol use: Yes    Comment: Socially  . Drug use: Yes    Types: Marijuana    Home Medications Prior to Admission medications   Medication Sig Start Date End Date Taking? Authorizing Provider  cyclobenzaprine (FLEXERIL) 10 MG tablet Take 1 tablet (10 mg total) by mouth at bedtime. 06/28/19   Placido Sou, PA-C  metroNIDAZOLE (FLAGYL) 500 MG tablet Take 1 tablet (500 mg total) by mouth 2 (two) times daily. 04/13/19   Elpidio Anis, PA-C    Allergies    Sulfa antibiotics  Review of Systems   Review of Systems  Genitourinary: Negative for dysuria, frequency, genital sores, hematuria, penile discharge, penile pain, penile swelling, scrotal swelling  and testicular pain.   Physical Exam Updated Vital Signs BP 126/75 (BP Location: Left Arm)   Pulse 81   Temp 98.1 F (36.7 C) (Oral)   Resp 18   Ht 6' (1.829 m)   Wt 93 kg   SpO2 100%   BMI 27.80 kg/m   Physical Exam Vitals and nursing note reviewed.  Constitutional:      General: He is not in acute distress.    Appearance: He is well-developed.  HENT:     Head: Normocephalic and atraumatic.     Right Ear: External ear normal.     Left Ear: External ear normal.  Eyes:     General: No scleral icterus.       Right eye: No discharge.        Left eye: No discharge.     Conjunctiva/sclera: Conjunctivae normal.  Neck:     Trachea: No tracheal deviation.  Cardiovascular:     Rate and Rhythm: Normal rate.  Pulmonary:     Effort: Pulmonary effort is normal. No respiratory distress.     Breath sounds: No stridor.  Abdominal:     General: There is no distension.  Genitourinary:    Comments: Nursing chaperone present.  Normal-appearing circumcised penis.  No pain noted along the penile shaft.  No erythema noted at the urethral meatus.  No discharge.  No scrotal swelling or pain.  No pain along the testicles or epididymis bilaterally.  No  lesions or erythema noted.  No lymphadenopathy. Musculoskeletal:        General: No swelling or deformity.     Cervical back: Neck supple.  Skin:    General: Skin is warm and dry.     Findings: No rash.  Neurological:     Mental Status: He is alert.     Cranial Nerves: Cranial nerve deficit: no gross deficits.    ED Results / Procedures / Treatments   Labs (all labs ordered are listed, but only abnormal results are displayed) Labs Reviewed - No data to display  EKG None  Radiology No results found.  Procedures Procedures   Medications Ordered in ED Medications - No data to display  ED Course  I have reviewed the triage vital signs and the nursing notes.  Pertinent labs & imaging results that were available during my care of  the patient were reviewed by me and considered in my medical decision making (see chart for details).    MDM Rules/Calculators/A&P                          Patient is a 25 year old male who presents to the emergency department for GU evaluation.  Patient recently had receptive oral intercourse with a new male partner who notified him that she has been positive for HSV in the past.  Patient is currently asymptomatic.  Physical exam is benign.  GU exam is benign.  Did not feel that testing was warranted at this time given patient is completely asymptomatic and has no lesions and he was agreeable.  We discussed safe sex practices at length.  Recommended patient refrain from sex and continue to monitor his symptoms for the next 1 to 2 weeks.  Urged him to return to the emergency department or health department if he should develop any new or worsening symptoms.  His questions were answered and he was amicable at the time of discharge.  Final Clinical Impression(s) / ED Diagnoses Final diagnoses:  Concern about STD in male without diagnosis   Rx / DC Orders ED Discharge Orders    None       Placido Sou, PA-C 07/17/20 1131    Rolan Bucco, MD 07/17/20 1232

## 2020-07-20 ENCOUNTER — Emergency Department (HOSPITAL_COMMUNITY)
Admission: EM | Admit: 2020-07-20 | Discharge: 2020-07-20 | Disposition: A | Discharge status: Home / Self Care | Payer: Self-pay | Attending: Emergency Medicine | Admitting: Emergency Medicine

## 2020-07-20 ENCOUNTER — Other Ambulatory Visit: Payer: Self-pay

## 2020-07-20 ENCOUNTER — Encounter (HOSPITAL_COMMUNITY): Payer: Self-pay | Admitting: Emergency Medicine

## 2020-07-20 DIAGNOSIS — F17210 Nicotine dependence, cigarettes, uncomplicated: Secondary | ICD-10-CM | POA: Insufficient documentation

## 2020-07-20 DIAGNOSIS — R369 Urethral discharge, unspecified: Secondary | ICD-10-CM | POA: Insufficient documentation

## 2020-07-20 DIAGNOSIS — R300 Dysuria: Secondary | ICD-10-CM | POA: Insufficient documentation

## 2020-07-20 DIAGNOSIS — Z202 Contact with and (suspected) exposure to infections with a predominantly sexual mode of transmission: Secondary | ICD-10-CM | POA: Insufficient documentation

## 2020-07-20 MED ORDER — STERILE WATER FOR INJECTION IJ SOLN
INTRAMUSCULAR | Status: AC
  Administered 2020-07-20: 2.1 mL
  Filled 2020-07-20: qty 10

## 2020-07-20 MED ORDER — DOXYCYCLINE HYCLATE 100 MG PO CAPS: 100.0000 mg | ORAL_CAPSULE | Freq: Two times a day (BID) | ORAL | 0 refills | Status: AC

## 2020-07-20 MED ORDER — DOXYCYCLINE HYCLATE 100 MG PO CAPS: 100.0000 mg | ORAL_CAPSULE | Freq: Two times a day (BID) | ORAL | 0 refills | Status: DC

## 2020-07-20 MED ORDER — DOXYCYCLINE HYCLATE 100 MG PO TABS
100.0000 mg | ORAL_TABLET | Freq: Once | ORAL | Status: AC
  Administered 2020-07-20: 100 mg via ORAL
  Filled 2020-07-20: qty 1

## 2020-07-20 MED ORDER — CEFTRIAXONE SODIUM 1 G IJ SOLR
500.0000 mg | Freq: Once | INTRAMUSCULAR | Status: AC
  Administered 2020-07-20: 500 mg via INTRAMUSCULAR
  Filled 2020-07-20: qty 10

## 2020-07-20 NOTE — ED Provider Notes (Signed)
[facility]-EMERGENCY DEPT Provider Note   CSN: [id number] Arrival date & time: 07/20/20  1651     History Chief Complaint  Patient presents with  . Exposure to STD    Allen Griffith is a 25 y.o. male past medical history of chlamydia, presenting to the ED for evaluation after positive exposure to chlamydia.  He states he had unprotected intercourse with a male partner who just tested positive for chlamydia.  He states he is having some dysuria and penile discharge.  He is not having any testicular pain or swelling, no rectal pain with bowel movement, no abdominal pain or fevers.  Has history of chlamydia in the past.  The history is provided by the patient.       History reviewed. No pertinent past medical history.  There are no problems to display for this patient.   History reviewed. No pertinent surgical history.     Family History  Family history unknown: Yes    Social History   Tobacco Use  . Smoking status: Current Every Day Smoker    Packs/day: 0.30    Types: Cigarettes  . Smokeless tobacco: Never Used  Vaping Use  . Vaping Use: Never used  Substance Use Topics  . Alcohol use: Yes    Comment: Socially  . Drug use: Yes    Types: Marijuana    Home Medications Prior to Admission medications   Medication Sig Start Date End Date Taking? Authorizing Provider  doxycycline (VIBRAMYCIN) 100 MG capsule Take 1 capsule (100 mg total) by mouth 2 (two) times daily for 7 days. 07/20/20 07/27/20 Yes Kort Stettler, [place] N, PA-C    Allergies    Sulfa antibiotics  Review of Systems   Review of Systems  Genitourinary: Positive for dysuria and penile discharge.  All other systems reviewed and are negative.   Physical Exam Updated Vital Signs BP 127/76 (BP Location: Left Arm)   Pulse 68   Temp 99 F (37.2 C) (Oral)   Resp 16   Ht 6' (1.829 m)   Wt 92.9 kg   SpO2 100%   BMI 27.78 kg/m   Physical Exam Vitals and nursing note reviewed.  Exam conducted with a chaperone present.  Constitutional:      General: He is not in acute distress.    Appearance: He is well-developed.  HENT:     Head: Normocephalic and atraumatic.  Eyes:     Conjunctiva/sclera: Conjunctivae normal.  Cardiovascular:     Rate and Rhythm: Normal rate.  Pulmonary:     Effort: Pulmonary effort is normal.  Abdominal:     Palpations: Abdomen is soft.     Tenderness: There is no abdominal tenderness.  Genitourinary:    Penis: Normal and circumcised. No erythema, tenderness or discharge.      Testes: Normal.        Right: Mass, tenderness or swelling not present.        Left: Mass, tenderness or swelling not present.     Epididymis:     Right: Normal.     Left: Normal.     Comments: Exam performed with nurse tech chaperone present. Neurological:     Mental Status: He is alert.  Psychiatric:        Mood and Affect: Mood normal.        Behavior: Behavior normal.     ED Results / Procedures / Treatments   Labs (all labs ordered are listed, but only abnormal results are displayed) Labs  Reviewed  GC/CHLAMYDIA PROBE AMP ([place]) NOT AT [facility]    EKG None  Radiology No results found.  Procedures Procedures   Medications Ordered in ED Medications  cefTRIAXone (ROCEPHIN) injection 500 mg (has no administration in time range)  doxycycline (VIBRA-TABS) tablet 100 mg (has no administration in time range)    ED Course  I have reviewed the triage vital signs and the nursing notes.  Pertinent labs & imaging results that were available during my care of the patient were reviewed by me and considered in my medical decision making (see chart for details).    MDM Rules/Calculators/A&P                          Patient presenting for exposure to chlamydia.  Is having some dysuria and penile discharge.  Patient is afebrile without abdominal tenderness, abdominal pain or painful bowel movements to indicate prostatitis.  No tenderness to  palpation of the testes or epididymis to suggest orchitis or epididymitis.  STD cultures obtained including gonorrhea and chlamydia. Patient to be discharged with instructions to follow up with PCP. Discussed importance of using protection when sexually active. Pt understands that they have GC/Chlamydia cultures pending and that they will need to inform all sexual partners if results return positive. Patient has been treated prophylactically with doxycycline and Rocephin.   Discussed results, findings, treatment and follow up. Patient advised of return precautions. Patient verbalized understanding and agreed with plan.   Final Clinical Impression(s) / ED Diagnoses Final diagnoses:  Exposure to chlamydia    Rx / DC Orders ED Discharge Orders         Ordered    doxycycline (VIBRAMYCIN) 100 MG capsule  2 times daily,   Status:  Discontinued        07/20/20 1716    doxycycline (VIBRAMYCIN) 100 MG capsule  2 times daily        07/20/20 2329           Tamme Mozingo, [place] N, PA-C 07/20/20 2333    Gilda Crease, MD 07/20/20 [phone]

## 2020-07-20 NOTE — ED Provider Notes (Signed)
Emergency Medicine Provider Triage Evaluation Note  Allen Griffith , a 25 y.o. male  was evaluated in triage.  Pt complains of penile discharge and concern for STDs. Recent unprotected intercourse with ?chlamydia. Denies testicle pain or swelling, rashes.  Review of Systems  Positive: Penile discharge Negative: Rash, testicle pain or swelling  Physical Exam  BP 127/76 (BP Location: Left Arm)   Pulse 68   Temp 99 F (37.2 C) (Oral)   Resp 16   Ht 6' (1.829 m)   Wt 92.9 kg   SpO2 100%   BMI 27.78 kg/m  Gen:   Awake, no distress   Resp:  Normal effort  MSK:   Moves extremities without difficulty  Other:  GU exam without any tenderness or external abnormalities  Medical Decision Making  Medically screening exam initiated at 5:13 PM.  Appropriate orders placed.  Allen Griffith was informed that the remainder of the evaluation will be completed by another provider, this initial triage assessment does not replace that evaluation, and the importance of remaining in the ED until their evaluation is complete.  Will need rocephin and doxycycline. Urine GC/chlamydia testing ordered.   Dietrich Pates, PA-C 07/20/20 2151    Terrilee Files, MD 07/22/20 [phone]

## 2020-07-20 NOTE — ED Triage Notes (Signed)
Per pt, states he wants to be treated and tested for STD's-having unprotected sex

## 2020-07-20 NOTE — Discharge Instructions (Addendum)
Please read the instructions below.  Talk with your primary care provider about any new medications.  You have begun treatment today for gonorrhea and chlamydia.  It is important you finish the antibiotic, doxycycline, to complete your treatment.  Take this once every 12 hours until gone.  It is important that you protect your skin from the sun if you plan for any prolonged sun exposure while taking this medication. A side effect of this medication includes increasing your skin sensitivity to UV rays and can result in rash. You will receive a call from the hospital if your test results come back positive. Avoid sexual activity until you know your test results. If your results come back positive, it is important that you inform all of your sexual partners.  Return to the ER for new pain with bowel movements, abdominal pain, fever, swelling/pain in your testicles, or new or worsening symptoms.   

## 2020-07-21 LAB — GC/CHLAMYDIA PROBE AMP (~~LOC~~) NOT AT ARMC
Chlamydia: NEGATIVE
Comment: NEGATIVE
Comment: NORMAL
Neisseria Gonorrhea: NEGATIVE

## 2021-10-14 ENCOUNTER — Encounter (HOSPITAL_COMMUNITY): Payer: Self-pay

## 2021-10-14 ENCOUNTER — Emergency Department (HOSPITAL_COMMUNITY)
Admission: EM | Admit: 2021-10-14 | Discharge: 2021-10-14 | Disposition: A | Discharge status: Home / Self Care | Payer: Self-pay | Attending: Emergency Medicine | Admitting: Emergency Medicine

## 2021-10-14 ENCOUNTER — Other Ambulatory Visit: Payer: Self-pay

## 2021-10-14 DIAGNOSIS — R300 Dysuria: Secondary | ICD-10-CM | POA: Insufficient documentation

## 2021-10-14 LAB — URINALYSIS, ROUTINE W REFLEX MICROSCOPIC
Bacteria, UA: NONE SEEN
Bilirubin Urine: NEGATIVE
Glucose, UA: NEGATIVE mg/dL
Hgb urine dipstick: NEGATIVE
Ketones, ur: NEGATIVE mg/dL
Nitrite: NEGATIVE
Protein, ur: NEGATIVE mg/dL
Specific Gravity, Urine: 1.015 (ref 1.005–1.030)
pH: 6 (ref 5.0–8.0)

## 2021-10-14 MED ORDER — DOXYCYCLINE HYCLATE 100 MG PO CAPS: 100.0000 mg | ORAL_CAPSULE | Freq: Two times a day (BID) | ORAL | 0 refills | Status: AC

## 2021-10-14 MED ORDER — LIDOCAINE HCL 1 % IJ SOLN
INTRAMUSCULAR | Status: AC
  Administered 2021-10-14: 20 mL
  Filled 2021-10-14: qty 20

## 2021-10-14 MED ORDER — CEPHALEXIN 500 MG PO CAPS: 500.0000 mg | ORAL_CAPSULE | Freq: Four times a day (QID) | ORAL | 0 refills | Status: AC

## 2021-10-14 MED ORDER — CEFTRIAXONE SODIUM 1 G IJ SOLR
500.0000 mg | Freq: Once | INTRAMUSCULAR | Status: AC
  Administered 2021-10-14: 500 mg via INTRAMUSCULAR
  Filled 2021-10-14: qty 10

## 2021-10-14 NOTE — ED Triage Notes (Signed)
Reports dysuria for a couple days.

## 2021-10-14 NOTE — ED Provider Notes (Signed)
[facility]-EMERGENCY DEPT Provider Note   CSN: [id number] Arrival date & time: 10/14/21  0252     History  Chief Complaint  Patient presents with   Dysuria    Allen Griffith is a 26 y.o. male presenting with a few days of dysuria.  Reports being sexually active without concerns for STDs but does state he has some whitish discharge.  No fevers or chills.  No abdominal pain.      Dysuria Presenting symptoms: dysuria        Home Medications Prior to Admission medications   Not on File      Allergies    Sulfa antibiotics    Review of Systems   Review of Systems  Genitourinary:  Positive for dysuria.    Physical Exam Updated Vital Signs BP 122/77 (BP Location: Left Arm)   Pulse 67   Temp 98.8 F (37.1 C) (Oral)   Resp 16   SpO2 99%  Physical Exam Vitals and nursing note reviewed.  Constitutional:      Appearance: Normal appearance.  HENT:     Head: Normocephalic and atraumatic.  Eyes:     General: No scleral icterus.    Conjunctiva/sclera: Conjunctivae normal.  Pulmonary:     Effort: Pulmonary effort is normal. No respiratory distress.  Genitourinary:    Comments: GU exam deferred Skin:    Findings: No rash.  Neurological:     Mental Status: He is alert.  Psychiatric:        Mood and Affect: Mood normal.     ED Results / Procedures / Treatments   Labs (all labs ordered are listed, but only abnormal results are displayed) Labs Reviewed  URINALYSIS, ROUTINE W REFLEX MICROSCOPIC - Abnormal; Notable for the following components:      Result Value   Leukocytes,Ua SMALL (*)    All other components within normal limits    EKG None  Radiology No results found.  Procedures Procedures   Medications Ordered in ED Medications - No data to display  ED Course/ Medical Decision Making/ A&P                           Medical Decision Making Amount and/or Complexity of Data Reviewed Labs: ordered.  Risk Prescription  drug management.   26 year old male presenting due to dysuria for a few days.  Reports being sexually active, last intercourse 2 weeks ago.  Does not always using protection.  UA: With WBCs and leukocytes.  Treatment: Rocephin shot and discharged with doxycycline  MDM/disposition: Patient is a 26 year old sexually active male presenting with dysuria.  He does have some leukocytes and WBCs in his urine.  It is atypical for male to develop urinary tract infection in the absence of STD.  We will go ahead and treat him with Rocephin, doxycycline and give him follow-up with health department as needed.  He is agreeable to this plan.  No signs of abdominal process or kidney involvement.  Discharged in hemodynamically stable conditions.   Final Clinical Impression(s) / ED Diagnoses Final diagnoses:  Dysuria    Rx / DC Orders ED Discharge Orders          Ordered    doxycycline (VIBRAMYCIN) 100 MG capsule  2 times daily        10/14/21 0843           Results and diagnoses were explained to the patient. Return precautions discussed in full. Patient  had no additional questions and expressed complete understanding.   This chart was dictated using voice recognition software.  Despite best efforts to proofread,  errors can occur which can change the documentation meaning.    Saddie Benders, PA-C 10/14/21 0855    Phoebe Sharps, DO 10/14/21 1027

## 2021-10-14 NOTE — Discharge Instructions (Addendum)
You have been treated for gonorrhea with the shot and the doxycycline at the pharmacy we will treat you for chlamydia.  I have also sent a medication called Keflex to your pharmacy.  This is used to treat urinary tract infections.  Return with any worsening symptoms, otherwise you may visit the health department with any other similar concerns.

## 2021-10-14 NOTE — ED Notes (Signed)
Called lab and added test for chlamydia to urine
